# Patient Record
Sex: Female | Born: 1970 | Race: White | Hispanic: No | Smoking: Current some day smoker
Health system: Southern US, Community
[De-identification: ages and names within clinical notes are randomized; demographics above are authoritative.]

## PROBLEM LIST (undated history)

## (undated) DIAGNOSIS — G935 Compression of brain: Secondary | ICD-10-CM

## (undated) DIAGNOSIS — E119 Type 2 diabetes mellitus without complications: Secondary | ICD-10-CM

## (undated) HISTORY — PX: NO PAST SURGERIES: SHX2092

## (undated) HISTORY — DX: Compression of brain: G93.5

---

## 2001-07-13 ENCOUNTER — Other Ambulatory Visit: Admission: RE | Admit: 2001-07-13 | Discharge: 2001-07-13 | Payer: Self-pay | Admitting: Obstetrics and Gynecology

## 2003-05-11 ENCOUNTER — Other Ambulatory Visit: Admission: RE | Admit: 2003-05-11 | Discharge: 2003-05-11 | Payer: Self-pay | Admitting: Obstetrics and Gynecology

## 2004-06-14 ENCOUNTER — Other Ambulatory Visit: Admission: RE | Admit: 2004-06-14 | Discharge: 2004-06-14 | Payer: Self-pay | Admitting: Obstetrics and Gynecology

## 2006-10-01 ENCOUNTER — Ambulatory Visit (HOSPITAL_COMMUNITY): Admission: RE | Admit: 2006-10-01 | Discharge: 2006-10-02 | Payer: Self-pay | Admitting: Orthopaedic Surgery

## 2012-06-06 ENCOUNTER — Emergency Department (HOSPITAL_BASED_OUTPATIENT_CLINIC_OR_DEPARTMENT_OTHER)
Admission: EM | Admit: 2012-06-06 | Discharge: 2012-06-06 | Disposition: A | Discharge status: Home / Self Care | Payer: BC Managed Care – PPO | Attending: Emergency Medicine | Admitting: Emergency Medicine

## 2012-06-06 ENCOUNTER — Ambulatory Visit (HOSPITAL_BASED_OUTPATIENT_CLINIC_OR_DEPARTMENT_OTHER)
Admission: RE | Admit: 2012-06-06 | Discharge: 2012-06-06 | Disposition: A | Discharge status: Home / Self Care | Payer: BC Managed Care – PPO | Source: Ambulatory Visit | Attending: Emergency Medicine | Admitting: Emergency Medicine

## 2012-06-06 ENCOUNTER — Encounter (HOSPITAL_BASED_OUTPATIENT_CLINIC_OR_DEPARTMENT_OTHER): Payer: Self-pay | Admitting: Emergency Medicine

## 2012-06-06 ENCOUNTER — Other Ambulatory Visit (HOSPITAL_BASED_OUTPATIENT_CLINIC_OR_DEPARTMENT_OTHER): Payer: Self-pay | Admitting: Emergency Medicine

## 2012-06-06 DIAGNOSIS — T1490XA Injury, unspecified, initial encounter: Secondary | ICD-10-CM

## 2012-06-06 DIAGNOSIS — Y939 Activity, unspecified: Secondary | ICD-10-CM | POA: Insufficient documentation

## 2012-06-06 DIAGNOSIS — S6990XA Unspecified injury of unspecified wrist, hand and finger(s), initial encounter: Secondary | ICD-10-CM | POA: Insufficient documentation

## 2012-06-06 DIAGNOSIS — W2209XA Striking against other stationary object, initial encounter: Secondary | ICD-10-CM | POA: Insufficient documentation

## 2012-06-06 DIAGNOSIS — S60229A Contusion of unspecified hand, initial encounter: Secondary | ICD-10-CM | POA: Insufficient documentation

## 2012-06-06 DIAGNOSIS — X58XXXA Exposure to other specified factors, initial encounter: Secondary | ICD-10-CM | POA: Insufficient documentation

## 2012-06-06 DIAGNOSIS — Y929 Unspecified place or not applicable: Secondary | ICD-10-CM | POA: Insufficient documentation

## 2012-06-06 DIAGNOSIS — T148XXA Other injury of unspecified body region, initial encounter: Secondary | ICD-10-CM

## 2012-06-06 MED ORDER — OXYCODONE-ACETAMINOPHEN 5-325 MG PO TABS
2.0000 | ORAL_TABLET | Freq: Once | ORAL | Status: AC
  Administered 2012-06-06: 2 via ORAL
  Filled 2012-06-06: qty 2

## 2012-06-06 MED ORDER — HYDROCODONE-ACETAMINOPHEN 5-500 MG PO TABS: 1.0000 | ORAL_TABLET | Freq: Four times a day (QID) | ORAL | Status: DC | PRN

## 2012-06-06 NOTE — ED Provider Notes (Signed)
History     CSN: [id number]  Arrival date & time 06/06/12  0101   None     No chief complaint on file.   (Consider location/radiation/quality/duration/timing/severity/associated sxs/prior treatment) Patient is a 41 y.o. female presenting with hand injury. The history is provided by the patient.  Hand Injury  The incident occurred 1 to 2 hours ago. The incident occurred at home. Injury mechanism: door hit right hand. The pain is present in the right hand. The quality of the pain is described as aching. The pain is severe. The pain has been constant since the incident. Pertinent negatives include no fever and no malaise/fatigue. She reports no foreign bodies present. She has tried nothing for the symptoms. The treatment provided no relief.  Door hit hand and it swelled up  No past medical history on file.  No past surgical history on file.  No family history on file.  History  Substance Use Topics  . Smoking status: Not on file  . Smokeless tobacco: Not on file  . Alcohol Use: Not on file    OB History    No data available      Review of Systems  Constitutional: Negative for fever and malaise/fatigue.  All other systems reviewed and are negative.    Allergies  Review of patient's allergies indicates not on file.  Home Medications  No current outpatient prescriptions on file.  There were no vitals taken for this visit.  Physical Exam  Constitutional: She is oriented to person, place, and time. She appears well-developed and well-nourished. No distress.  HENT:  Head: Normocephalic and atraumatic.  Eyes: Conjunctivae normal are normal. Pupils are equal, round, and reactive to light.  Neck: Normal range of motion. Neck supple.  Cardiovascular: Normal rate and regular rhythm.   Pulmonary/Chest: Effort normal and breath sounds normal. She has no wheezes.  Abdominal: Soft. Bowel sounds are normal. There is no tenderness.  Musculoskeletal: Normal range of motion.     Right hand: Normal strength noted.       Hands: Neurological: She is alert and oriented to person, place, and time.  Skin: Skin is warm and dry.  Psychiatric: She has a normal mood and affect.    ED Course  Procedures (including critical care time)   Labs Reviewed  PREGNANCY, URINE   No results found.   No diagnosis found.    MDM  Refused tetanus.  Bacitracin applied.  Return for weakness numbness worsening swelling.  Follow up with hand surgery as needed.  Ice for 20 minutes q 4 hrs.  Elevate above the level of the heart.  Patient verbalizes understanding and agrees to follow up        Evertt Chouinard Smitty Cords, MD 06/06/12 [phone]

## 2019-05-26 ENCOUNTER — Other Ambulatory Visit: Payer: Self-pay | Admitting: Otolaryngology

## 2019-05-26 DIAGNOSIS — IMO0001 Reserved for inherently not codable concepts without codable children: Secondary | ICD-10-CM

## 2019-05-26 DIAGNOSIS — H918X1 Other specified hearing loss, right ear: Secondary | ICD-10-CM

## 2019-10-20 ENCOUNTER — Other Ambulatory Visit: Payer: Self-pay | Admitting: Obstetrics and Gynecology

## 2019-10-20 DIAGNOSIS — D259 Leiomyoma of uterus, unspecified: Secondary | ICD-10-CM

## 2019-10-20 DIAGNOSIS — R5381 Other malaise: Secondary | ICD-10-CM

## 2019-10-28 ENCOUNTER — Other Ambulatory Visit: Payer: Self-pay | Admitting: Obstetrics and Gynecology

## 2019-10-31 ENCOUNTER — Other Ambulatory Visit: Payer: Self-pay | Admitting: Obstetrics and Gynecology

## 2019-10-31 DIAGNOSIS — D259 Leiomyoma of uterus, unspecified: Secondary | ICD-10-CM

## 2019-11-11 ENCOUNTER — Inpatient Hospital Stay: Admission: RE | Admit: 2019-11-11 | Payer: Self-pay | Source: Ambulatory Visit

## 2019-11-17 ENCOUNTER — Ambulatory Visit
Admission: RE | Admit: 2019-11-17 | Discharge: 2019-11-17 | Disposition: A | Discharge status: Home / Self Care | Payer: No Typology Code available for payment source | Source: Ambulatory Visit | Attending: Obstetrics and Gynecology | Admitting: Obstetrics and Gynecology

## 2019-11-17 DIAGNOSIS — D259 Leiomyoma of uterus, unspecified: Secondary | ICD-10-CM

## 2019-11-22 ENCOUNTER — Other Ambulatory Visit: Payer: Self-pay

## 2019-11-28 ENCOUNTER — Other Ambulatory Visit: Payer: Self-pay

## 2020-04-07 ENCOUNTER — Ambulatory Visit
Admission: RE | Admit: 2020-04-07 | Discharge: 2020-04-07 | Disposition: A | Discharge status: Home / Self Care | Payer: BC Managed Care – PPO | Source: Ambulatory Visit | Attending: Otolaryngology | Admitting: Otolaryngology

## 2020-04-07 ENCOUNTER — Other Ambulatory Visit: Payer: Self-pay

## 2020-04-07 DIAGNOSIS — IMO0001 Reserved for inherently not codable concepts without codable children: Secondary | ICD-10-CM

## 2020-04-07 MED ORDER — GADOBENATE DIMEGLUMINE 529 MG/ML IV SOLN
15.0000 mL | Freq: Once | INTRAVENOUS | Status: AC | PRN
  Administered 2020-04-07: 15 mL via INTRAVENOUS

## 2020-04-27 IMAGING — CT CT ABDOMEN W/O CM
2 of 4 series · 16 of 46 positions shown, 18 images · non-contrast
Comparison: None.

CLINICAL DATA: Fibroids, heavy flow periods, history of tummy tuck
and C-section

EXAM:
CT ABDOMEN WITHOUT CONTRAST
TECHNIQUE: Multidetector CT imaging of the abdomen was performed following the
standard protocol without IV contrast.

[Series 2: adrenals 3.00 br40 s3 axial st · axial · 0.78mm/px · z∈[+1375,+1645]mm · 13 of 100 slices shown, 15 images]
[im 5/100  soft-tissue]
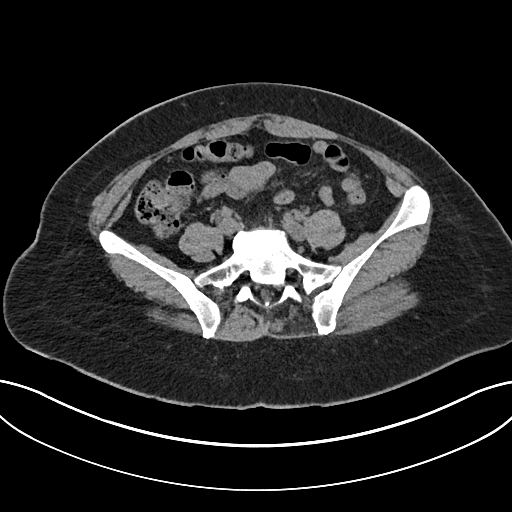
[im 5/100  bone]
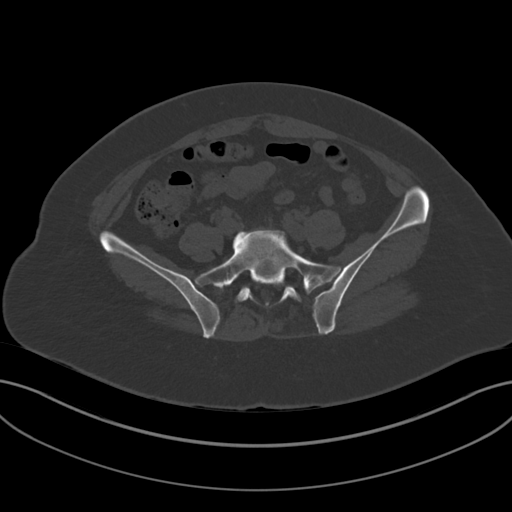
[im 15/100  soft-tissue]
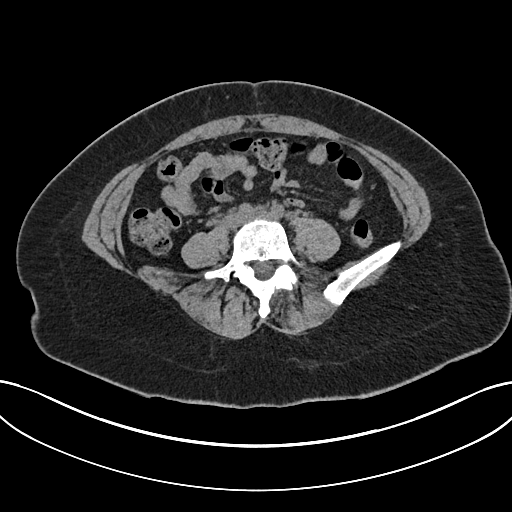
[im 20/100  soft-tissue]
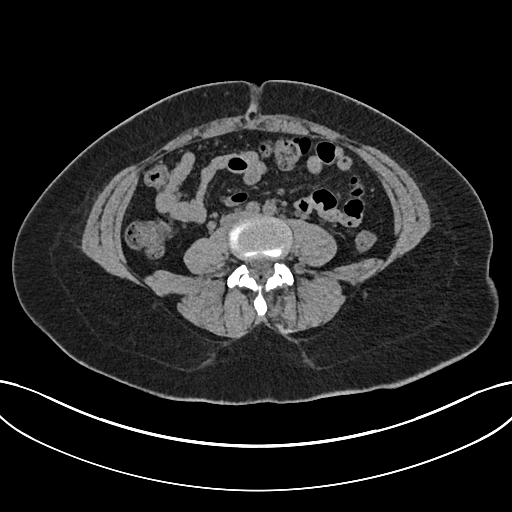
[im 30/100  soft-tissue]
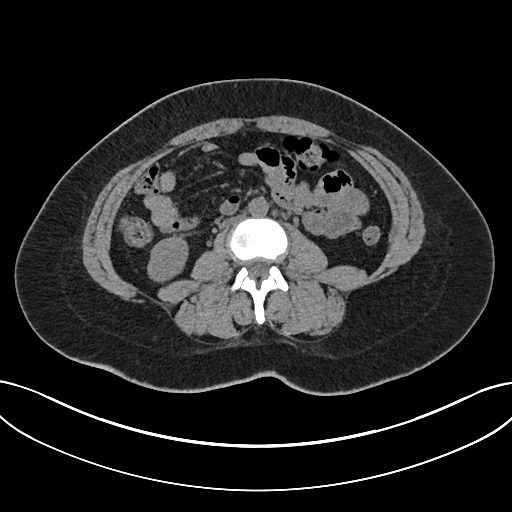
[im 35/100  soft-tissue]
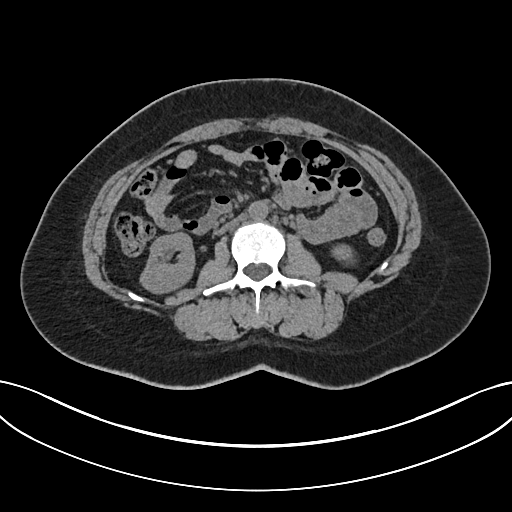
[im 45/100  soft-tissue]
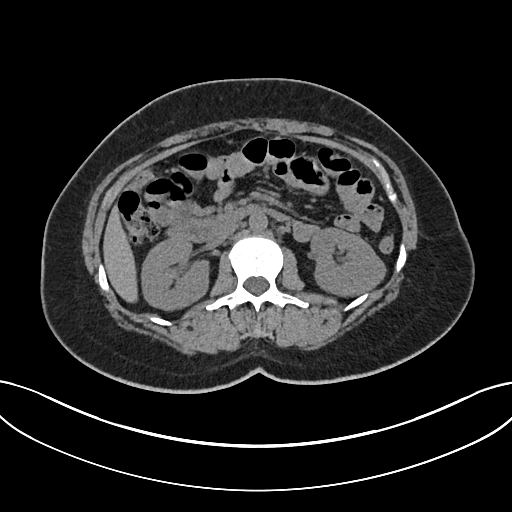
[im 50/100  soft-tissue]
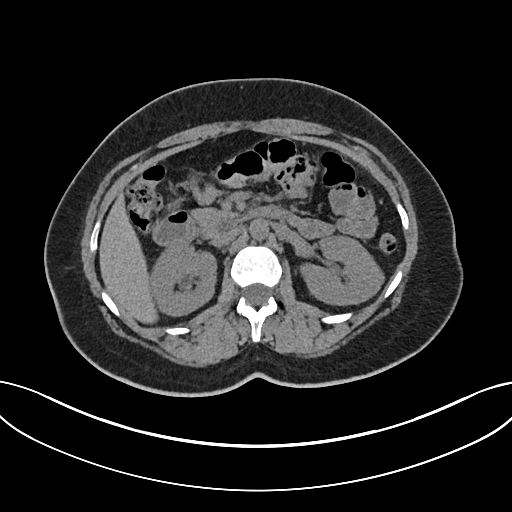
[im 55/100  soft-tissue]
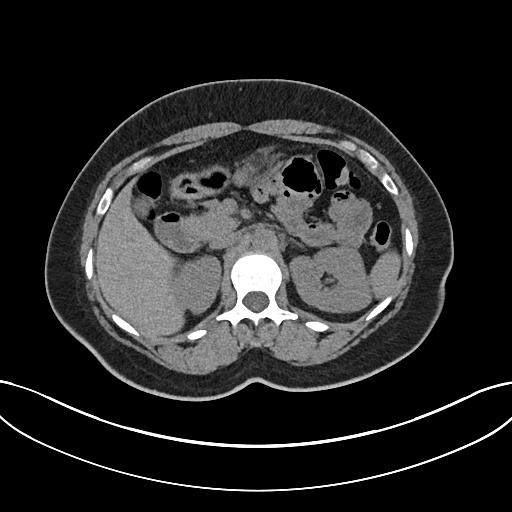
[im 65/100  soft-tissue]
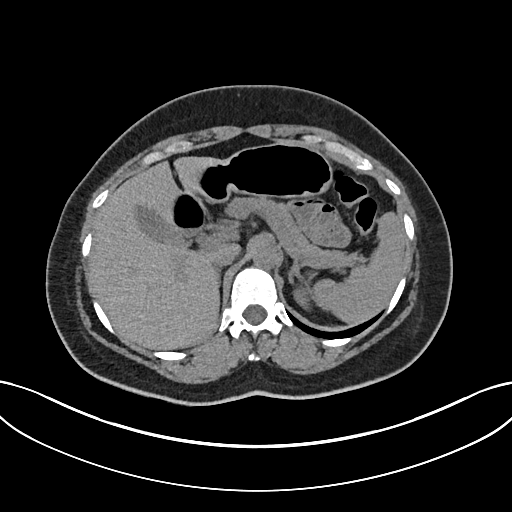
[im 65/100  bone]
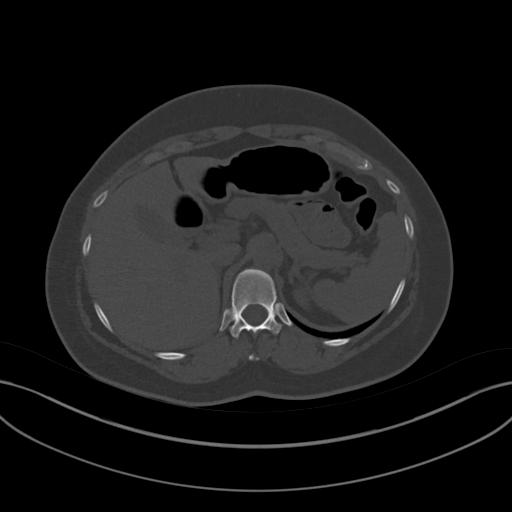
[im 70/100  soft-tissue]
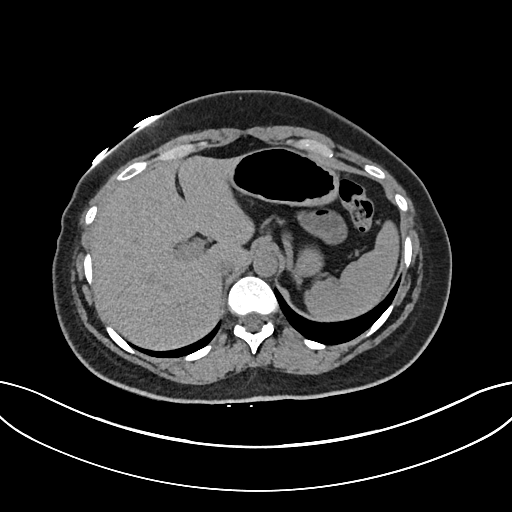
[im 80/100  soft-tissue]
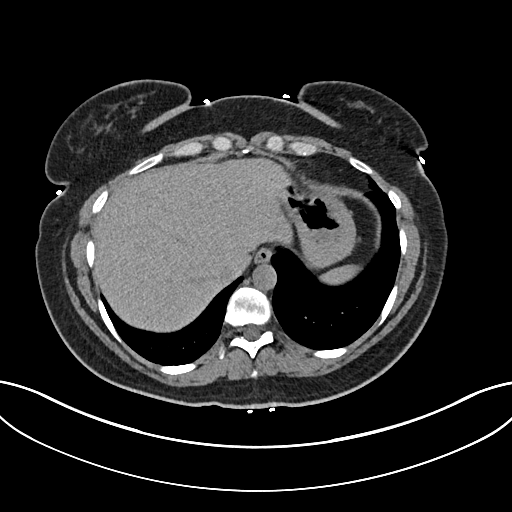
[im 85/100  soft-tissue]
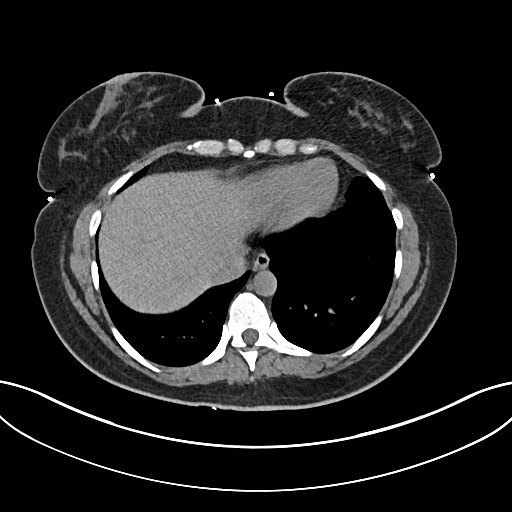
[im 95/100  soft-tissue]
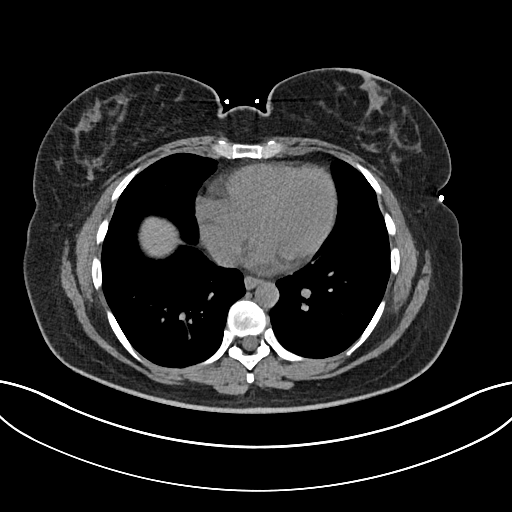

[Series 5: adrenals 2.00 br40 s3 cor st · coronal · 0.59mm/px · 3 of 144 slices shown]
[im 48/144  soft-tissue]
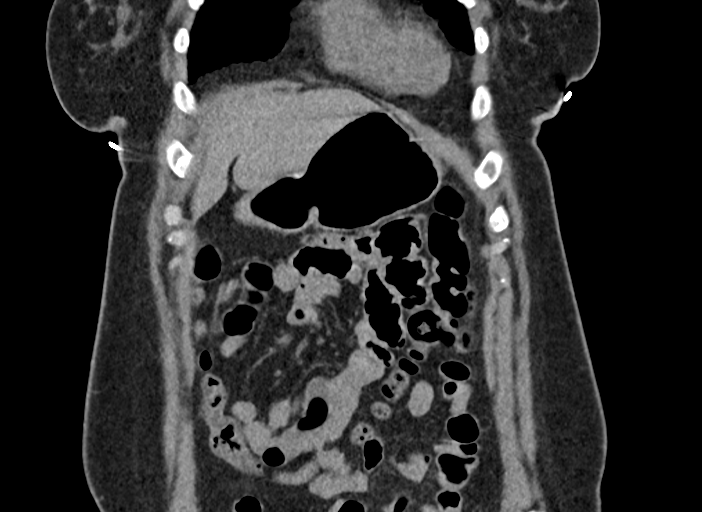
[im 64/144  soft-tissue]
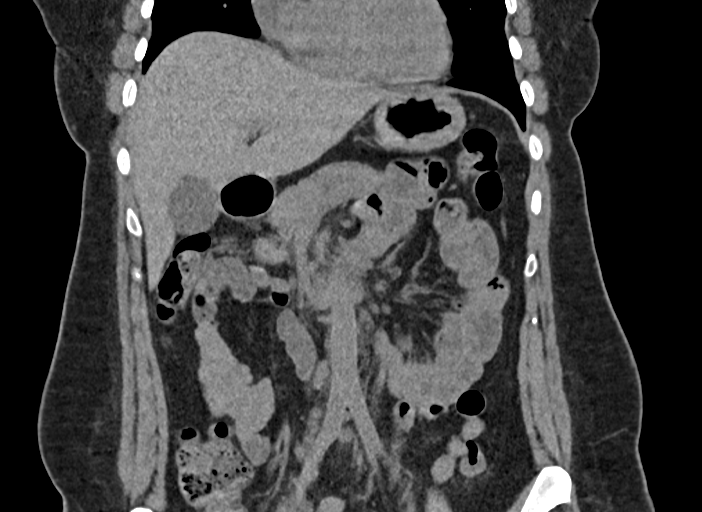
[im 80/144  soft-tissue]
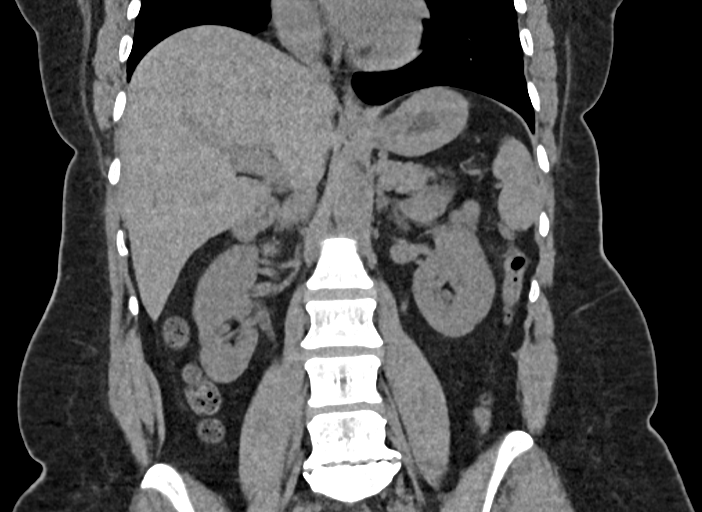

[16 of 46 positions shown; findings below may reference images not displayed]

FINDINGS: Lower chest: No acute abnormality.

Hepatobiliary: No focal liver abnormality is seen. No gallstones,
gallbladder wall thickening, or biliary dilatation.

Pancreas: Unremarkable. No pancreatic ductal dilatation or
surrounding inflammatory changes.

Spleen: Normal in size without focal abnormality.

Adrenals/Urinary Tract: Adrenal glands are unremarkable. Kidneys are
normal, without renal calculi, focal lesion, or hydronephrosis.

Stomach/Bowel: Stomach is within normal limits. Appendix appears
normal. No evidence of bowel wall thickening, distention, or
inflammatory changes.

Vascular/Lymphatic: No significant vascular findings are present. No
enlarged abdominal lymph nodes.

Other: No abdominal wall hernia or abnormality.

Musculoskeletal: No acute or significant osseous findings.
IMPRESSION: No noncontrast CT abnormality of the abdomen.

## 2021-07-04 LAB — COLOGUARD: COLOGUARD: NEGATIVE

## 2022-07-01 ENCOUNTER — Telehealth: Payer: Self-pay

## 2022-07-01 NOTE — Telephone Encounter (Signed)
Spoke with patient - She was seen at [facility]. Records are being sent over to [place] and the patient is requesting an appointment with Dr Jaynee Eagles at our office. Ct and MRI discs are being sent over to [place] through the mail, should be expected either Friday or Monday.

## 2022-07-01 NOTE — Telephone Encounter (Signed)
Spoke with patient again. Stated she spoke with [facility] and they have shared her imaging with [place] through [facility] and both faxed and mailed her records over to [place]. We will review records once received

## 2022-09-24 ENCOUNTER — Ambulatory Visit: Payer: BC Managed Care – PPO | Admitting: Neurology

## 2022-09-24 NOTE — Progress Notes (Deleted)
[facility]    Provider:  Dr Jaynee Eagles Requesting Provider: Leonel Ramsay, MD Primary Care Provider:  Haywood Pao, MD  CC:  ***  HPI:  Tara Torres is a 52 y.o. female here as requested by Leonel Ramsay, MD for ***.  ***  04/08/2020: CLINICAL DATA:  Asymmetric hearing loss, right   EXAM: MRI HEAD WITHOUT AND WITH CONTRAST   TECHNIQUE: Multiplanar, multiecho pulse sequences of the brain and surrounding structures were obtained without and with intravenous contrast.   CONTRAST:  85m MULTIHANCE GADOBENATE DIMEGLUMINE 529 MG/ML IV SOLN   COMPARISON:  None.   FINDINGS: BRAIN: No acute infarct, acute hemorrhage or extra-axial collection. Normal white matter signal. Normal volume of CSF spaces. No chronic microhemorrhage. Normal midline structures. There is no cerebellopontine angle mass. The cochleae and semicircular canals are normal. No focal abnormality along the course of the 7th and 8th cranial nerves. Normal porus acusticus and vestibular aqueduct bilaterally.   VASCULAR: Major flow voids are preserved.   SKULL AND UPPER CERVICAL SPINE: Normal calvarium and skull base. Visualized upper cervical spine and soft tissues are normal.   SINUSES/ORBITS: No paranasal sinus fluid levels or advanced mucosal thickening. No mastoid or middle ear effusion. Normal orbits.   IMPRESSION: Normal MRI of the brain and internal auditory canals.  Reviewed notes, labs and imaging from outside physicians, which showed ***  Review of Systems: Patient complains of symptoms per HPI as well as the following symptoms ***. Pertinent negatives and positives per HPI. All others negative.   Social History   Socioeconomic History   Marital status: Divorced    Spouse name: Not on file   Number of children: Not on file   Years of education: Not on file   Highest education level: Not on file  Occupational History   Not on file  Tobacco Use   Smoking status:  Not on file   Smokeless tobacco: Not on file  Substance and Sexual Activity   Alcohol use: Not on file   Drug use: Not on file   Sexual activity: Not on file  Other Topics Concern   Not on file  Social History Narrative   Not on file   Social Determinants of Health   Financial Resource Strain: Not on file  Food Insecurity: Not on file  Transportation Needs: Not on file  Physical Activity: Not on file  Stress: Not on file  Social Connections: Not on file  Intimate Partner Violence: Not on file    No family history on file.  No past medical history on file.  There are no problems to display for this patient.   *** The histories are not reviewed yet. Please review them in the "History" navigator section and refresh this SElkhart  Current Outpatient Medications  Medication Sig Dispense Refill   HYDROcodone-acetaminophen (VICODIN) 5-500 MG per tablet Take 1 tablet by mouth every 6 (six) hours as needed for pain. 10 tablet 0   No current facility-administered medications for this visit.    Allergies as of 09/24/2022   (Not on File)    Vitals: There were no vitals taken for this visit. Last Weight:  Wt Readings from Last 1 Encounters:  No data found for Wt   Last Height:   Ht Readings from Last 1 Encounters:  No data found for Ht     Physical exam: Exam: Gen: NAD, conversant, well nourised, obese, well groomed  CV: RRR, no MRG. No Carotid Bruits. No peripheral edema, warm, nontender Eyes: Conjunctivae clear without exudates or hemorrhage  Neuro: Detailed Neurologic Exam  Speech:    Speech is normal; fluent and spontaneous with normal comprehension.  Cognition:    The patient is oriented to person, place, and time;     recent and remote memory intact;     language fluent;     normal attention, concentration,     fund of knowledge Cranial Nerves:    The pupils are equal, round, and reactive to light. The fundi are normal and  spontaneous venous pulsations are present. Visual fields are full to finger confrontation. Extraocular movements are intact. Trigeminal sensation is intact and the muscles of mastication are normal. The face is symmetric. The palate elevates in the midline. Hearing intact. Voice is normal. Shoulder shrug is normal. The tongue has normal motion without fasciculations.   Coordination:    Normal finger to nose and heel to shin. Normal rapid alternating movements.   Gait:    Heel-toe and tandem gait are normal.   Motor Observation:    No asymmetry, no atrophy, and no involuntary movements noted. Tone:    Normal muscle tone.    Posture:    Posture is normal. normal erect    Strength:    Strength is V/V in the upper and lower limbs.      Sensation: intact to LT     Reflex Exam:  DTR's:    Deep tendon reflexes in the upper and lower extremities are normal bilaterally.   Toes:    The toes are downgoing bilaterally.   Clonus:    Clonus is absent.    Assessment/Plan:      No orders of the defined types were placed in this encounter.  No orders of the defined types were placed in this encounter.   Cc: Leonel Ramsay, MD,  Tisovec, Fransico Him, MD  Sarina Ill, MD  [facility] [address] [place], [place] [address]  Phone [phone] Fax [phone]

## 2022-09-29 ENCOUNTER — Ambulatory Visit: Payer: BC Managed Care – PPO | Admitting: Neurology

## 2022-09-29 ENCOUNTER — Encounter: Payer: Self-pay | Admitting: Neurology

## 2022-09-29 VITALS — Ht 63.0 in | Wt 174.0 lb

## 2022-09-29 DIAGNOSIS — H9191 Unspecified hearing loss, right ear: Secondary | ICD-10-CM

## 2022-09-29 DIAGNOSIS — R200 Anesthesia of skin: Secondary | ICD-10-CM

## 2022-09-29 DIAGNOSIS — R252 Cramp and spasm: Secondary | ICD-10-CM

## 2022-09-29 DIAGNOSIS — R42 Dizziness and giddiness: Secondary | ICD-10-CM

## 2022-09-29 DIAGNOSIS — H9313 Tinnitus, bilateral: Secondary | ICD-10-CM

## 2022-09-29 DIAGNOSIS — G935 Compression of brain: Secondary | ICD-10-CM

## 2022-09-29 DIAGNOSIS — R2689 Other abnormalities of gait and mobility: Secondary | ICD-10-CM

## 2022-09-29 DIAGNOSIS — H93A9 Pulsatile tinnitus, unspecified ear: Secondary | ICD-10-CM

## 2022-09-29 DIAGNOSIS — G950 Syringomyelia and syringobulbia: Secondary | ICD-10-CM

## 2022-09-29 MED ORDER — ACETAZOLAMIDE 125 MG PO TABS: 125.0000 mg | ORAL_TABLET | Freq: Two times a day (BID) | ORAL | 6 refills | Status: DC

## 2022-09-29 NOTE — Progress Notes (Unsigned)
[facility]    Provider:  Dr Jaynee Eagles Requesting Provider: Tisovec, Fransico Him, MD Primary Care Provider:  Jettie Booze, NP  CC:  chiari  HPI:  Tara Torres is a 52 y.o. female here as requested by Tisovec, Fransico Him, MD for chiari malformation. 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 No headaches. Hearing loss on the right, only on the right, some dizziness with closing eyes, she has some stiffening of her fingers. Some numbness in the lateral thighs, she feels she is not as sharp. No neck pain. No cough headaches. No exertional headaches. She has bruxism but no exertional headaches. A few times after exercising has heard a heartbeat in her head/pulsatile tinnitus. Happened a few times and not always with exercises. She has a lot of tinnitus. More hearing loss on the right especially when laying down. She has some numbness on the left side of the face. No numbness. But cramping in the hands. We discussed chiari in detail, extended visit, reviewed images from 2021 and 2023, compared, discussed etiology, symptoms  Reviewed notes, labs and imaging from outside physicians, which showed:  Reviewed EKG 60 November 2023 normal sinus rhythm, normal EKG  Labs collected June 19, 2022 including a unremarkable CBC slight anemia hemoglobin 11.6 otherwise unremarkable, CMP with elevated glucose 171, BUN 8, creatinine 0.66 otherwise unremarkable,hgba1c 7.3 05/2022  04/08/2020: CLINICAL DATA:  Asymmetric hearing loss, right   EXAM: MRI HEAD WITHOUT AND WITH CONTRAST 2021   TECHNIQUE: Multiplanar, multiecho pulse sequences of the brain and surrounding structures were obtained without and with intravenous contrast.   CONTRAST:  39m MULTIHANCE GADOBENATE DIMEGLUMINE 529 MG/ML IV SOLN   COMPARISON:  None.   FINDINGS: (disagree with report on chiari, reviewed  images, chiari also present in 2021 appears stable as compared to 2023) BRAIN: No acute infarct, acute hemorrhage or extra-axial collection. Normal white matter signal. Normal volume of CSF spaces. No chronic microhemorrhage. Normal midline structures. There is no cerebellopontine angle mass. The cochleae and semicircular canals are normal. No focal abnormality along the course of the 7th and 8th cranial nerves. Normal porus acusticus and vestibular aqueduct bilaterally.   VASCULAR: Major flow voids are preserved.   SKULL AND UPPER CERVICAL SPINE: Normal calvarium and skull base. Visualized upper cervical spine and soft tissues are normal.   SINUSES/ORBITS: No paranasal sinus fluid levels or advanced mucosal thickening. No mastoid or middle ear effusion. Normal orbits.   IMPRESSION: Normal MRI of the brain and internal auditory canals.  MRI brain 06/2022: reviewd images and agree: MRI brain without contrast:  INDICATION:  TECHNIQUE: Imaging of the brain was performed in 3 planes utilizing a combination of T1, FLAIR, and T2 weighting. Diffusion images were performed.  COMPARISON:  FINDINGS: #  Ventricles: Normal without dilatation, mass effect, or midline shift. #  Brain parenchyma: No areas of abnormally increased or abnormally decreased signal intensity are identified.  Diffusion-weighted images are normal indicating no acute infarct.  No evidence mass or hemorrhage.   #  Sella and parasellar region: Normal. #  Craniovertebral junction: Normal. #  Mastoids and paranasal sinuses: Clear #  Orbits: Unremarkable #  Major cerebral vessels and dural sinuses: Patent #  Bony structures and scalp: Unremarkable Allograft there is herniation of the right cerebellar tonsil approximately 7.5 mm beyond the foramen magnum. There is herniation of the right cerebellar tonsil approximately 3  mm beyond the foramen magnum.  #   Procedure Note  Lucy Chris, MD -  06/19/2022 Formatting of this note might be different from the original. MRI brain without contrast:  INDICATION:  TECHNIQUE: Imaging of the brain was performed in 3 planes utilizing a combination of T1, FLAIR, and T2 weighting. Diffusion images were performed.  COMPARISON:  FINDINGS: #  Ventricles: Normal without dilatation, mass effect, or midline shift. #  Brain parenchyma: No areas of abnormally increased or abnormally decreased signal intensity are identified.  Diffusion-weighted images are normal indicating no acute infarct.  No evidence mass or hemorrhage.   #  Sella and parasellar region: Normal. #  Craniovertebral junction: Normal. #  Mastoids and paranasal sinuses: Clear #  Orbits: Unremarkable #  Major cerebral vessels and dural sinuses: Patent #  Bony structures and scalp: Unremarkable Allograft there is herniation of the right cerebellar tonsil approximately 7.5 mm beyond the foramen magnum. There is herniation of the right cerebellar tonsil approximately 3 mm beyond the foramen magnum.  #     IMPRESSION: No intracranial hemorrhage.  No acute infarction.  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   Review of Systems: Patient complains of symptoms per HPI as well as the following symptoms dizziness, hearing loss, tinnitus. Pertinent negatives and positives per HPI. All others negative.   Social History   Socioeconomic History   Marital status: Divorced    Spouse name: Not on file   Number of children: Not on file   Years of education: Not on file   Highest education level: Not on file  Occupational History   Not on file  Tobacco Use   Smoking status: Never   Smokeless tobacco: Never  Substance and Sexual Activity   Alcohol use: Yes    Alcohol/week: 8.0 standard drinks of alcohol    Types: 8 Glasses of wine per week   Drug use: Not on file   Sexual activity: Not on  file  Other Topics Concern   Not on file  Social History Narrative   Not on file   Social Determinants of Health   Financial Resource Strain: Not on file  Food Insecurity: Not on file  Transportation Needs: Not on file  Physical Activity: Not on file  Stress: Not on file  Social Connections: Not on file  Intimate Partner Violence: Not on file    Family History  Problem Relation Age of Onset   Chiari malformation Neg Hx     Past Medical History:  Diagnosis Date   Chiari I malformation [facility])     Patient Active Problem List   Diagnosis Date Noted   Chiari I malformation ([place]) 09/30/2022    Past Surgical History:  Procedure Laterality Date   NO PAST SURGERIES      Current Outpatient Medications  Medication Sig Dispense Refill   acetaZOLAMIDE (DIAMOX) 125 MG tablet Take 1 tablet (125 mg total) by mouth 2 (two) times daily. 60 tablet 6   diazepam (VALIUM) 5 MG tablet TAKE 1 TABLET BY MOUTH EVERY TWELVE HOURS AS NEEDED FOR anxiety OR SLEEP     Ferrous Sulfate (IRON PO) Take 65 mg by mouth daily at 2 PM. 3 tablets daily     HUMALOG 100 UNIT/ML injection Inject into the skin.     Insulin Disposable Pump (OMNIPOD 5 G6 PODS, GEN 5,) MISC Inject into the skin.     tranexamic  acid (LYSTEDA) 650 MG TABS tablet Take 1,300 mg by mouth.     VITAMIN D PO Take by mouth.     No current facility-administered medications for this visit.    Allergies as of 09/29/2022   (Not on File)    Vitals: Ht '5\' 3"'$  (1.6 m)   Wt 174 lb (78.9 kg)   BMI 30.82 kg/m  Last Weight:  Wt Readings from Last 1 Encounters:  09/29/22 174 lb (78.9 kg)   Last Height:   Ht Readings from Last 1 Encounters:  09/29/22 '5\' 3"'$  (1.6 m)     Physical exam: Exam: Gen: NAD, conversant, well nourised, obese, well groomed                     CV: RRR, no MRG. No Carotid Bruits. No peripheral edema, warm, nontender Eyes: Conjunctivae clear without exudates or hemorrhage  Neuro: Detailed Neurologic  Exam  Speech:    Speech is normal; fluent and spontaneous with normal comprehension.  Cognition:    The patient is oriented to person, place, and time;     recent and remote memory intact;     language fluent;     normal attention, concentration,     fund of knowledge Cranial Nerves:    The pupils are equal, round, and reactive to light. The fundi are normal and spontaneous venous pulsations are present. Visual fields are full to finger confrontation. Extraocular movements are intact. Trigeminal sensation is intact and the muscles of mastication are normal. The face is symmetric. The palate elevates in the midline. Hearing intact. Voice is normal. Shoulder shrug is normal. The tongue has normal motion without fasciculations.   Coordination:    Normal finger to nose and heel to shin. Normal rapid alternating movements.   Gait:    Heel-toe and tandem gait are normal.   Motor Observation:    No asymmetry, no atrophy, and no involuntary movements noted. Tone:    Normal muscle tone.    Posture:    Posture is normal. normal erect    Strength:    Strength is V/V in the upper and lower limbs.      Sensation: intact to LT     Reflex Exam:  DTR's:    Deep tendon reflexes in the upper and lower extremities are normal bilaterally.   Toes:    The toes are downgoing bilaterally.   Clonus:    Clonus is absent.    Assessment/Plan:  Patient with there is herniation of the right cerebellar tonsil approximately 7.5 mm beyond the foramen magnum. There is herniation of the left cerebellar tonsil approximately 3 mm beyond the foramen magnum.  Patient has hearing loss on the right, imbalance, tinnitus, dizziness, all on the right considering the right cerebellar tonsil distention is much larger than the left I do suspect this may be symptomatic although she is not having some of the other more common symptoms such as exertional headaches but she does have pulsatile tinnitus.  Also having problems  with her hands and cramping need to check MRI of the cervical spine for syrinx and MRA for aneurysm.  Will send to neurosurgery for evaluation of decompression surgery.  In the meantime we will try some Diamox to see if this lessens her symptoms. All images are in canopy.       Orthostatic BP 100/52 87/57 138/68  Orthostatic Pulse 67 74 82  Weight 174 lb (78.9 kg)    Height '5\' 3"'$  (1.6 m)  2023 MRI images: see both 2021 and 2023 MRIs in canopy          Orders Placed This Encounter  Procedures   MR ANGIO HEAD WO CONTRAST   MR CERVICAL SPINE [place]   Ambulatory referral to Neurosurgery   Meds ordered this encounter  Medications   acetaZOLAMIDE (DIAMOX) 125 MG tablet    Sig: Take 1 tablet (125 mg total) by mouth 2 (two) times daily.    Dispense:  60 tablet    Refill:  6    Cc: Tisovec, Fransico Him, MD,  Jettie Booze, NP  Sarina Ill, MD  [facility] [address] [place], [place] [address]  Phone [phone] Fax [phone]  I spent over 110 minutes of face-to-face and non-face-to-face time with patient on the  1. Chiari I malformation ([place])   2. Tinnitus of both ears   3. Hearing loss of right ear, unspecified hearing loss type   4. Dizziness   5. Imbalance   6. 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 Hand numbness   9. Hand cramp    diagnosis.  This included previsit chart review, lab review, study review, order entry, electronic health record documentation, patient education on the different diagnostic and therapeutic options, counseling and coordination of care, risks and benefits of management, compliance, or risk factor reduction

## 2022-09-29 NOTE — Patient Instructions (Addendum)
MRA head MRI cervical spine to look for syrinx Referral to neurosurgery - Dr. Zada Finders  Acetazolamide Tablets What is this medication? ACETAZOLAMIDE (a set a ZOLE a mide) reduces swelling related to heart disease. It may also be used to reduce swelling caused by medications. It helps your kidneys remove more fluid and salt from your blood through the urine. It may also be used to treat conditions with increased pressure of the eye, such as glaucoma. It can be used with other medications to prevent and control seizures in people with epilepsy. It can also be used to prevent or treat symptoms of altitude sickness. It works by increasing the amount of oxygen in your body. It belongs to a group of medications called diuretics. This medicine may be used for other purposes; ask your health care provider or pharmacist if you have questions. COMMON BRAND NAME(S): Diamox What should I tell my care team before I take this medication? They need to know if you have any of these conditions: Glaucoma Kidney disease Liver disease Low adrenal gland function Lung or breathing disease An unusual or allergic reaction to acetazolamide, other medications, foods, dyes, or preservatives Pregnant or trying to get pregnant Breast-feeding How should I use this medication? Take this medication by mouth. Take it as directed on the prescription label at the same time every day. You can take it with or without food. If it upsets your stomach, take it with food. Keep taking it unless your care team tells you to stop. Talk to your care team about the use of this medication in children. Special care may be needed. Overdosage: If you think you have taken too much of this medicine contact a poison control center or emergency room at once. NOTE: This medicine is only for you. Do not share this medicine with others. What if I miss a dose? If you miss a dose, take it as soon as you can. If it is almost time for your next dose,  take only that dose. Do not take double or extra doses. What may interact with this medication? Do not take this medication with any of the following: Methazolamide This medication may also interact with the following: Aspirin and aspirin-like medications Cyclosporine Lithium Medication for diabetes Methenamine Other diuretics Phenytoin Primidone Quinidine Sodium bicarbonate Stimulant medications, such as dextroamphetamine This list may not describe all possible interactions. Give your health care provider a list of all the medicines, herbs, non-prescription drugs, or dietary supplements you use. Also tell them if you smoke, drink alcohol, or use illegal drugs. Some items may interact with your medicine. What should I watch for while using this medication? Visit your care team for regular checks on your progress. Tell your care team if your symptoms do not start to get better or if they get worse. This medication may cause serious skin reactions. They can happen weeks to months after starting the medication. Contact your care team right away if you notice fevers or flu-like symptoms with a rash. The rash may be red or purple and then turn into blisters or peeling of the skin. Or, you might notice a red rash with swelling of the face, lips, or lymph nodes in your neck or under your arms. This medication may affect your coordination, reaction time, or judgment. Do not drive or operate machinery until you know how this medication affects you. Sit up or stand slowly to reduce the risk of dizzy or fainting spells. What side effects may I notice from  receiving this medication? Side effects that you should report to your care team as soon as possible: Allergic reactions--skin rash, itching, hives, swelling of the face, lips, tongue, or throat Aplastic anemia--unusual weakness or fatigue, dizziness, headache, trouble breathing, increased bleeding or bruising High acid level--trouble breathing,  unusual weakness or fatigue, confusion, headache, fast or irregular heartbeat, nausea, vomiting Infection--fever, chills, cough, or sore throat Kidney stones--blood in the urine, pain or trouble passing urine, pain in the lower back or sides Liver injury--right upper belly pain, loss of appetite, nausea, light-colored stool, dark yellow or brown urine, yellowing skin or eyes, unusual weakness or fatigue Low potassium level--muscle pain or cramps, unusual weakness or fatigue, fast or irregular heartbeat, constipation Redness, blistering, peeling, or loosening of the skin, including inside the mouth Side effects that usually do not require medical attention (report to your care team if they continue or are bothersome): Blurry vision Change in taste Loss of appetite Pain, tingling, or numbness in the hands or feet This list may not describe all possible side effects. Call your doctor for medical advice about side effects. You may report side effects to FDA at 1-800-FDA-1088. Where should I keep my medication? Keep out of the reach of children and pets. Store at room temperature between 20 and 25 degrees C (68 and 77 degrees F). Throw away any unused medication after the expiration date. NOTE: This sheet is a summary. It may not cover all possible information. If you have questions about this medicine, talk to your doctor, pharmacist, or health care provider.  2023 Elsevier/Gold Standard (2021-06-04 00:00:00)   Chiari Malformation  Chiari malformation (CM) is a type of brain abnormality that affects the parts of the brain called the cerebellum and the brain stem. The cerebellum is important for balance, and the brain stem is important for basic body functions, such as breathing and swallowing. Normally, the cerebellum is located in a space at the back of the skull, just above the opening in the skull (foramen magnum)where the spinal cord meets the brain stem. With CM, part of the cerebellum is  located below the foramen magnum instead. The malformation can be mild with no or few symptoms, or it can be severe. CM can cause neck pain, headaches, balance problems, and other symptoms. What are the causes? CM is a condition that a person is born with (congenital). In rare cases, CM may also develop later in life, which is called acquired CM or secondary CM. These cases may be caused by a leak of the fluid that surrounds and protects the brain and spinal cord (cerebrospinal fluid), leading to low pressure. In acquired or secondary CM, abnormal pressure develops in the brain. This pushes the cerebellum down into the foramen magnum. What increases the risk? The following factors may make you more likely to develop this condition: Being female. Having a family history of CM. What are the signs or symptoms? Symptoms of this condition may vary depending on the severity of your CM. In some cases, there are no symptoms. In other cases, symptoms may come and go. The most common symptom is a severe headache in the back of the head. The headache: May come and go. May spread to your neck and shoulders. May be worse when you cough, sneeze, or strain. Other symptoms include: Difficulty balancing or loss of coordination. Vision problems, such as double vision, tiny spots moving across your vision (floaters), or sensitivity to lights. Trouble swallowing or speaking or hearing. Feeling dizzy or fainting.  Breathing problems, including breathing pauses during sleep (sleep apnea). Curved back (scoliosis). Inability to control when you urinate (incontinence). How is this diagnosed? This condition may be evaluated with a medical history and physical exam. This may include tests to check your balance and nerves (neurological exam). You may also have imaging tests, such as a CT scan or an MRI. How is this treated? Treatment for this condition depends on the severity of your symptoms. You may be treated  with: Surgery to prevent the malformation from getting worse, or to treat severe symptoms or symptoms that are getting worse. Medicines or alternative treatments to relieve headaches or neck pain. If you do not have symptoms, you may not need treatment. Follow these instructions at home: Medicines Take over-the-counter and prescription medicines only as told by your health care provider. Ask your health care provider if the medicine prescribed to you requires you to avoid driving or using machinery. General instructions If you feel like you might faint: Lie down right away and raise (elevate) your feet above the level of your heart. Breathe deeply and steadily. Wait until all of the symptoms have passed. If you have problems with dizziness, get up slowly when lying down. Take several minutes to sit and then stand. Ask your health care provider which activities are safe for you and if you have any activity restrictions. Do not use any products that contain nicotine or tobacco. These products include cigarettes, chewing tobacco, and vaping devices, such as e-cigarettes. If you need help quitting, ask your health care provider. Drink enough fluid to keep your urine pale yellow. Consider joining a CM support group. Keep all follow-up visits. This is important. Where to find more information [employer] of Neurological Disorders and Stroke: [employer]Box[employer]it Contact a health care provider if: You have new symptoms. Your symptoms get worse. Get help right away if: You develop weakness or numbness in one or more of your limbs. You develop dizziness, slurred speech, double vision, weakness, or numbness with a severe headache. These symptoms may represent a serious problem that is an emergency. Do not wait to see if the symptoms will go away. Get medical help right away. Call your local emergency services (911 in the [place] Summary A Chiari malformation is a condition in which part of the  cerebellum moves down through the foramen magnum. The malformation can be mild with no symptoms, or it can be severe. In some cases, no treatment is needed. In others, medicines are used to treat headaches. Surgery is done in the worst of cases. This information is not intended to replace advice given to you by your health care provider. Make sure you discuss any questions you have with your health care provider. Document Revised: 10/16/2020 Document Reviewed: 10/16/2020 Elsevier Patient Education  [employer].

## 2022-09-30 ENCOUNTER — Encounter: Payer: Self-pay | Admitting: Neurology

## 2022-09-30 DIAGNOSIS — G935 Compression of brain: Secondary | ICD-10-CM | POA: Insufficient documentation

## 2022-10-01 ENCOUNTER — Telehealth: Payer: Self-pay | Admitting: Neurology

## 2022-10-01 NOTE — Telephone Encounter (Signed)
Referral for neurosurgery fax to [facility]. Phone: [phone], Fax: [phone].

## 2022-10-08 ENCOUNTER — Ambulatory Visit (INDEPENDENT_AMBULATORY_CARE_PROVIDER_SITE_OTHER): Payer: BC Managed Care – PPO

## 2022-10-08 DIAGNOSIS — H93A9 Pulsatile tinnitus, unspecified ear: Secondary | ICD-10-CM

## 2022-10-08 DIAGNOSIS — R200 Anesthesia of skin: Secondary | ICD-10-CM | POA: Diagnosis not present

## 2022-10-08 DIAGNOSIS — H9191 Unspecified hearing loss, right ear: Secondary | ICD-10-CM

## 2022-10-08 DIAGNOSIS — G935 Compression of brain: Secondary | ICD-10-CM | POA: Diagnosis not present

## 2022-10-08 DIAGNOSIS — R2689 Other abnormalities of gait and mobility: Secondary | ICD-10-CM | POA: Diagnosis not present

## 2022-10-08 DIAGNOSIS — H9313 Tinnitus, bilateral: Secondary | ICD-10-CM

## 2022-10-08 DIAGNOSIS — G950 Syringomyelia and syringobulbia: Secondary | ICD-10-CM

## 2022-10-08 DIAGNOSIS — R252 Cramp and spasm: Secondary | ICD-10-CM

## 2022-10-08 DIAGNOSIS — R42 Dizziness and giddiness: Secondary | ICD-10-CM

## 2022-10-09 ENCOUNTER — Telehealth: Payer: Self-pay | Admitting: *Deleted

## 2022-10-09 NOTE — Telephone Encounter (Signed)
-----   Message from Melvenia Beam, MD sent at 10/09/2022  3:19 PM EST ----- The chiari has gotten a bit worse now 10-76m I did send in a referral to neurosurgery for you to discuss. 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 I do recommend discussing compression of the chiari with neurosurgery thanks dr aJaynee Eagles

## 2022-10-09 NOTE — Telephone Encounter (Signed)
-----   Message from Melvenia Beam, MD sent at 10/09/2022  3:19 PM EST ----- The chiari has gotten a bit worse now 10-89m I did send in a referral to neurosurgery for you to discuss. 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 I do recommend discussing compression of the chiari with neurosurgery thanks dr aJaynee Eagles

## 2022-10-09 NOTE — Telephone Encounter (Signed)
Spoke to patient have MRI and MRA results Pt had questions about her blood flow in brain I read MRA results to patient . Pt asked about the Syrinx explained to patient there was no fluid pocket  around spinal cord  Gave Patient Dr Ruthine Dose name and #. Pt expressed understanding and thanked me for calling

## 2022-10-09 NOTE — Telephone Encounter (Signed)
-----   Message from Melvenia Beam, MD sent at 10/09/2022  3:19 PM EST ----- The chiari has gotten a bit worse now 10-34m I did send in a referral to neurosurgery for you to discuss. 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 I do recommend discussing compression of the chiari with neurosurgery thanks dr aJaynee Eagles

## 2024-07-11 ENCOUNTER — Encounter (HOSPITAL_COMMUNITY): Payer: Self-pay | Admitting: Obstetrics and Gynecology

## 2024-07-11 NOTE — Progress Notes (Addendum)
 Spoke w/ via phone for pre-op interview--- Danayah Lab needs dos----  CBC, T&S per protocol. BMP EKG and A1C per anesthesia. CBG and UPT day of surgery. Preop lab appt 07/14/24 at 1 PM        Lab results------ COVID test -----patient states asymptomatic no test needed Arrive at -------0530 NPO after MN NO Solid Food.   Pre-Surgery Ensure or G2:  Med rec completed Medications to take morning of surgery ----- NONE Diabetic medication ----- Wears insulin pump. Pt endocrinologist Dr Hosie. Pt contacted MD and awaiting reply about insulin pump instructions for pre/postop.  GLP1 agonist last dose: GLP1 instructions:  Patient instructed no nail polish to be worn day of surgery Patient instructed to bring photo id and insurance card day of surgery Patient aware to have Driver (ride ) / caregiver    for 24 hours after surgery - Joe Burcham-boyfriend Patient Special Instructions -----shower with CHG night before surgery. Pre-Op special Instructions -----Wears Dexcom and insulin pump, will be on each arm day of surgery.  Patient verbalized understanding of instructions that were given at this phone interview. Patient denies chest pain, sob, fever, cough at the interview.

## 2024-07-11 NOTE — Progress Notes (Signed)
 Surgical Instructions  Your procedure is scheduled on :   Report to [facility] Main Entrance A at 5:30 AM, then check in the Admitting office. Any questions or running late day of surgery :  call [phone]  Questions prior to your surgery day:  call [phone], Monday -- Friday 8am - 4pm. If you experience any cold or flu symptoms such as cough, fever, chills, shortness of breath, etc. between now and you scheduled surgery, please notify your surgeon office.   Remember: Do Not eat any food after midnight the night before surgery.     This includes No water,  candy,  gum, and mints.  PLEASE FOLLOW DR LAMBETH'S INSTRUCTIONS ABOUT INSULIN PUMP BASAL SETTINGS.   One week prior to surgery, STOP taking any Aspirin (unless otherwise instructed by your surgeon) Aleve, Naproxen, ibuprofen, Motrin, Advil, Goody's, BC's, all herbal medications/ supplements, fish oil, and non-prescription vitamins.  Do NOT Smoke (tobacco/ vaping) and Do Not drink alcohol for 24 hours prior to your procedure.  For those patients that use a CPAP.  Please bring your CPAP/ mask/ tubing with them day of surgery . Anesthesia may ask recovery room nurse to use and if you stay the night you be asked to use it.  You will be asked to removed any contacts, glasses, piercing's, hearing aid's, dentures/ partials prior to surgery.  Please bring cases/ container/ solution/ etc., for them day of surgery.   Patients discharged the day of surgery will NOT be allowed to drive home.  You must have responsible driver and caregiver to stay at home with you the next 24 hours.  SURGICAL WAITING ROOM VISITATION Patients may have no more than 2 support people in the waiting area - if more than 2 , these visitors may rotate.  Pre-op nurse will coordinate an appropriate time for 1 Adult support person, who may not rotate, to accompany patient in pre-op.  Aware some patients may have certain circumstances, speak to pre-op nurse day of  surgery.  Children under the age 70 must have an adult with them who is not the patient and must remain in the main waiting area with an adult.  If the patient needs to stay at the hospital during part of their recovery, the visitor guidelines for inpatient rooms apply.  Please refer to the [facility] website for the visitor guidelines for any additional information.  If you received a COVID test during your pre-op visit it is requested that you wear a mask when out in public, stay away from anyone that may not be feeling well and notify your surgeon if you develop symptoms.  If you have been in contact with anyone that has tested positive in the past 10 days notify your surgeon.     [facility] - Preparing for Surgery  Before surgery, you can play an important role. Because skin is not sterile, it needs to be as free of germs as possible. You can reduce the number of germs on your skin by washing with CHG (chlorhexidine gluconate) soap before surgery. CHG is an antiseptic cleaner which kills germs and bonds with the skin to continue killing germs even after washing. Oral hygiene is also important in reducing the risk of infection. Remember to brush your teeth with your regular toothpaste the morning of surgery.  Please DO NOT use if you have an allergy to CHG or antibacterial soaps. If your skin becomes reddened/irritated stop using the CHG and inform your Pre-op nurse day of surgery.  DO NOT shave (including legs and genital area) for at least 48 hours prior to your CHG shower.   Please follow these instructions carefully:  Shower with CHG soap the night before surgery. If you choose to wash your hair, wash your hair first as usual with your normal shampoo. After you shampoo, rinse your hair and body thoroughly to remove the shampoo. Use CHG as you would any other liquid soap. You can apply CHG directly to the skin and wash gently with a clean washcloth or shower sponge. Apply the CHG  soap to your body ONLY FROM THE NECK DOWN. Do not use on open wounds or open sores. Avoid contact with your eyes, ears, mouth, and genitals (private parts). Wash genitals (private parts) with your normal soap. Wash thoroughly, paying special attention to the area where your surgery will be performed. Thoroughly rinse your body with warm water from the neck down. DO NOT shower/wash with your normal soap after using and rinsing off the CHG soap. DO NOT use lotions, oils, etc., after showering with CHG. Pat yourself dry with a clean towel. Wear clean pajamas. Place clean sheets on your bed the night of your CHG shower and do not sleep with pets.  Day of Surgery  DO NOT Apply any lotions,  powder,  oils,  deodorants (may use underarm deodorant),  cologne/  perfumes  or makeup Do Not wear jewelry /  piercing's/  metal/  permanent jewelry must be removed prior to arrival day of surgery. (No plastic piercing) Do Not wear nail polish,  gel polish,  artificial nails, or any other type of covering on natural finger nails (toe nails are okay) Remember to brush your teeth and rinse mouth out. Put on clean / comfortable clothes.  is not responsible for valuables/ personal belongings

## 2024-07-14 ENCOUNTER — Inpatient Hospital Stay (HOSPITAL_COMMUNITY): Admission: RE | Admit: 2024-07-14 | Discharge: 2024-07-14 | Attending: Obstetrics and Gynecology

## 2024-07-14 DIAGNOSIS — Z01818 Encounter for other preprocedural examination: Secondary | ICD-10-CM

## 2024-07-14 DIAGNOSIS — E109 Type 1 diabetes mellitus without complications: Secondary | ICD-10-CM

## 2024-07-14 DIAGNOSIS — D219 Benign neoplasm of connective and other soft tissue, unspecified: Secondary | ICD-10-CM

## 2024-07-14 LAB — COMPREHENSIVE METABOLIC PANEL WITH GFR
ALT: 17 U/L (ref 0–44)
AST: 21 U/L (ref 15–41)
Albumin: 3.7 g/dL (ref 3.5–5.0)
Alkaline Phosphatase: 87 U/L (ref 38–126)
Anion gap: 7 (ref 5–15)
BUN: 12 mg/dL (ref 6–20)
CO2: 28 mmol/L (ref 22–32)
Calcium: 9.8 mg/dL (ref 8.9–10.3)
Chloride: 102 mmol/L (ref 98–111)
Creatinine, Ser: 0.67 mg/dL (ref 0.44–1.00)
GFR, Estimated: >60 mL/min (ref >60)
Glucose, Bld: 251 mg/dL — ABNORMAL HIGH (ref 70–99)
Potassium: 4.5 mmol/L (ref 3.5–5.1)
Sodium: 137 mmol/L (ref 135–145)
Total Bilirubin: 1 mg/dL (ref 0.0–1.2)
Total Protein: 7.2 g/dL (ref 6.5–8.1)

## 2024-07-14 LAB — HEMOGLOBIN A1C
Hgb A1c MFr Bld: 7.6 % — ABNORMAL HIGH (ref 4.8–5.6)
Mean Plasma Glucose: 171.42 mg/dL

## 2024-07-14 LAB — TYPE AND SCREEN
ABO/RH(D): O NEG
Antibody Screen: NEGATIVE

## 2024-07-14 LAB — CBC
HCT: 41.6 % (ref 36.0–46.0)
Hemoglobin: 13.2 g/dL (ref 12.0–15.0)
MCH: 30.1 pg (ref 26.0–34.0)
MCHC: 31.7 g/dL (ref 30.0–36.0)
MCV: 95 fL (ref 80.0–100.0)
Platelets: 278 K/uL (ref 150–400)
RBC: 4.38 MIL/uL (ref 3.87–5.11)
RDW: 12.9 % (ref 11.5–15.5)
WBC: 7.7 K/uL (ref 4.0–10.5)
nRBC: 0 % (ref 0.0–0.2)

## 2024-07-15 LAB — SYPHILIS: RPR W/REFLEX TO RPR TITER AND TREPONEMAL ANTIBODIES, TRADITIONAL SCREENING AND DIAGNOSIS ALGORITHM: RPR Ser Ql: NONREACTIVE

## 2024-07-18 NOTE — Anesthesia Preprocedure Evaluation (Signed)
 Anesthesia Evaluation  Patient identified by MRN, date of birth, ID band Patient awake    Reviewed: Allergy & Precautions, H&P , NPO status , Patient's Chart, lab work & pertinent test results  Airway Mallampati: I  TM Distance: >3 FB Neck ROM: Full    Dental no notable dental hx. (+) Dental Advisory Given, Teeth Intact   Pulmonary neg pulmonary ROS, Current Smoker and Patient abstained from smoking.   Pulmonary exam normal breath sounds clear to auscultation       Cardiovascular Exercise Tolerance: Good negative cardio ROS Normal cardiovascular exam Rhythm:Regular Rate:Normal     Neuro/Psych Chiari I malformation negative neurological ROS  negative psych ROS   GI/Hepatic negative GI ROS, Neg liver ROS,,,  Endo/Other  negative endocrine ROSdiabetes, Insulin  Dependent    Renal/GU negative Renal ROS  negative genitourinary   Musculoskeletal negative musculoskeletal ROS (+)    Abdominal   Peds negative pediatric ROS (+)  Hematology negative hematology ROS (+)   Anesthesia Other Findings   Reproductive/Obstetrics negative OB ROS                              Anesthesia Physical Anesthesia Plan  ASA: 3  Anesthesia Plan: General   Post-op Pain Management: Tylenol  PO (pre-op)*, Celebrex  PO (pre-op)*, Dilaudid  IV and Precedex   Induction: Intravenous  PONV Risk Score and Plan: 3  Airway Management Planned: Oral ETT  Additional Equipment: None  Intra-op Plan:   Post-operative Plan: Extubation in OR  Informed Consent: I have reviewed the patients History and Physical, chart, labs and discussed the procedure including the risks, benefits and alternatives for the proposed anesthesia with the patient or authorized representative who has indicated his/her understanding and acceptance.       Plan Discussed with: Anesthesiologist and CRNA  Anesthesia Plan Comments: (  )          Anesthesia Quick Evaluation

## 2024-07-18 NOTE — H&P (Signed)
 Tara Torres is an 53 y.o. female. She has fibroids and very heavy menses. U/S in office notes uterus 10.3 x 7.9 x 7.1 cm with fibroids measuring about 4.5 - 2 cm.   Pertinent Gynecological History: Menses: flow is excessive with use of many pads or tampons on heaviest days Bleeding:  Contraception: none DES exposure: denies Blood transfusions: none Sexually transmitted diseases: no past history Previous GYN Procedures:   Last mammogram: normal Date: 10/24 Last pap: normal Date: 10/24 OB History: G3, P1   Menstrual History: Menarche age:  No LMP recorded (lmp unknown).    Past Medical History:  Diagnosis Date   Chiari I malformation (HCC)    Diabetes mellitus without complication (HCC)     Past Surgical History:  Procedure Laterality Date   NO PAST SURGERIES      Family History  Problem Relation Age of Onset   Chiari malformation Neg Hx     Social History:  reports that she has never smoked. She has never used smokeless tobacco. She reports current alcohol use of about 8.0 standard drinks of alcohol per week. No history on file for drug use.  Allergies: Allergies[1]  No medications prior to admission.    Review of Systems  Constitutional:  Negative for fever.    Height 5' 3 (1.6 m), weight 72.6 kg. Physical Exam Cardiovascular:     Rate and Rhythm: Normal rate.  Pulmonary:     Effort: Pulmonary effort is normal.     No results found for this or any previous visit (from the past 24 hours).  No results found.  Assessment/Plan: 53 yo with very heavy menses and uterine fibroids LAVH/BS D/W patient. She wants to retain ovaries if they appear normal. Will remove one or both ovaries if they appear abnormal. D/W procedure and risks including infection, organ damage, bleeding/transfusion-HIV/Hep, DVT/PE, pneumonia. D/W possible conversion to abdominal hysterectomy. She states she understands and agrees Preop medical clearance with endocrinologist for Type I DM  done. She will continue CGM and insulin  pump on her arms.  Tara Torres II 07/18/2024, 5:51 PM     [1]  Allergies Allergen Reactions   Penicillins Nausea And Vomiting

## 2024-07-19 ENCOUNTER — Other Ambulatory Visit: Payer: Self-pay

## 2024-07-19 ENCOUNTER — Encounter (HOSPITAL_COMMUNITY): Admission: RE | Disposition: A | Payer: Self-pay | Source: Home / Self Care | Attending: Obstetrics and Gynecology

## 2024-07-19 ENCOUNTER — Ambulatory Visit (HOSPITAL_COMMUNITY)
Admission: RE | Admit: 2024-07-19 | Discharge: 2024-07-19 | Disposition: A | Attending: Obstetrics and Gynecology | Admitting: Obstetrics and Gynecology

## 2024-07-19 ENCOUNTER — Encounter (HOSPITAL_COMMUNITY): Payer: Self-pay | Admitting: Obstetrics and Gynecology

## 2024-07-19 ENCOUNTER — Ambulatory Visit (HOSPITAL_COMMUNITY): Payer: Self-pay | Admitting: Anesthesiology

## 2024-07-19 DIAGNOSIS — Z9641 Presence of insulin pump (external) (internal): Secondary | ICD-10-CM | POA: Insufficient documentation

## 2024-07-19 DIAGNOSIS — D259 Leiomyoma of uterus, unspecified: Secondary | ICD-10-CM | POA: Diagnosis present

## 2024-07-19 DIAGNOSIS — N920 Excessive and frequent menstruation with regular cycle: Secondary | ICD-10-CM | POA: Diagnosis not present

## 2024-07-19 DIAGNOSIS — E109 Type 1 diabetes mellitus without complications: Secondary | ICD-10-CM

## 2024-07-19 DIAGNOSIS — D219 Benign neoplasm of connective and other soft tissue, unspecified: Secondary | ICD-10-CM | POA: Diagnosis present

## 2024-07-19 DIAGNOSIS — D251 Intramural leiomyoma of uterus: Secondary | ICD-10-CM | POA: Diagnosis not present

## 2024-07-19 DIAGNOSIS — Z01818 Encounter for other preprocedural examination: Secondary | ICD-10-CM

## 2024-07-19 DIAGNOSIS — Z794 Long term (current) use of insulin: Secondary | ICD-10-CM | POA: Diagnosis not present

## 2024-07-19 HISTORY — PX: LAPAROSCOPIC VAGINAL HYSTERECTOMY WITH SALPINGO OOPHORECTOMY: SHX6681

## 2024-07-19 HISTORY — DX: Type 2 diabetes mellitus without complications: E11.9

## 2024-07-19 LAB — GLUCOSE, CAPILLARY: Glucose-Capillary: 279 mg/dL — ABNORMAL HIGH (ref 70–99)

## 2024-07-19 LAB — POCT PREGNANCY, URINE: Preg Test, Ur: NEGATIVE

## 2024-07-19 SURGERY — HYSTERECTOMY, VAGINAL, LAPAROSCOPY-ASSISTED, WITH SALPINGO-OOPHORECTOMY
Anesthesia: General | Site: Pelvis | Laterality: Bilateral

## 2024-07-19 MED ORDER — CLINDAMYCIN PHOSPHATE 900 MG/50ML IV SOLN
INTRAVENOUS | Status: AC
  Filled 2024-07-19: qty 50

## 2024-07-19 MED ORDER — SOD CITRATE-CITRIC ACID 500-334 MG/5ML PO SOLN: 30.0000 mL | ORAL | Status: DC

## 2024-07-19 MED ORDER — 0.9 % SODIUM CHLORIDE (POUR BTL) OPTIME
TOPICAL | Status: DC | PRN
  Administered 2024-07-19: 08:00:00 1000 mL

## 2024-07-19 MED ORDER — PHENYLEPHRINE 80 MCG/ML (10ML) SYRINGE FOR IV PUSH (FOR BLOOD PRESSURE SUPPORT)
PREFILLED_SYRINGE | INTRAVENOUS | Status: DC | PRN
  Administered 2024-07-19: 08:00:00 80 ug via INTRAVENOUS

## 2024-07-19 MED ORDER — ROCURONIUM BROMIDE 10 MG/ML (PF) SYRINGE
PREFILLED_SYRINGE | INTRAVENOUS | Status: AC
  Filled 2024-07-19: qty 20

## 2024-07-19 MED ORDER — AMISULPRIDE (ANTIEMETIC) 5 MG/2ML IV SOLN
10.0000 mg | Freq: Once | INTRAVENOUS | Status: AC
  Administered 2024-07-19: 12:00:00 10 mg via INTRAVENOUS

## 2024-07-19 MED ORDER — PROPOFOL 10 MG/ML IV BOLUS
INTRAVENOUS | Status: AC
  Filled 2024-07-19: qty 20

## 2024-07-19 MED ORDER — OXYCODONE HCL 5 MG PO TABS: 5.0000 mg | ORAL_TABLET | Freq: Four times a day (QID) | ORAL | 0 refills | Status: AC | PRN

## 2024-07-19 MED ORDER — BUPIVACAINE HCL (PF) 0.5 % IJ SOLN
INTRAMUSCULAR | Status: DC | PRN
  Administered 2024-07-19: 08:00:00 30 mL

## 2024-07-19 MED ORDER — ORAL CARE MOUTH RINSE: 15.0000 mL | Freq: Once | OROMUCOSAL | Status: AC

## 2024-07-19 MED ORDER — LACTATED RINGERS IV SOLN: INTRAVENOUS | Status: DC

## 2024-07-19 MED ORDER — SUGAMMADEX SODIUM 200 MG/2ML IV SOLN
INTRAVENOUS | Status: DC | PRN
  Administered 2024-07-19: 11:00:00 200 mg via INTRAVENOUS

## 2024-07-19 MED ORDER — MIDAZOLAM HCL 2 MG/2ML IJ SOLN
INTRAMUSCULAR | Status: AC
  Filled 2024-07-19: qty 2

## 2024-07-19 MED ORDER — BUPIVACAINE HCL (PF) 0.5 % IJ SOLN
INTRAMUSCULAR | Status: AC
  Filled 2024-07-19: qty 30

## 2024-07-19 MED ORDER — HYDROMORPHONE HCL 1 MG/ML IJ SOLN
0.2000 mg | INTRAMUSCULAR | Status: DC | PRN
  Administered 2024-07-19: 13:00:00 0.6 mg via INTRAVENOUS
  Filled 2024-07-19: qty 1

## 2024-07-19 MED ORDER — FENTANYL CITRATE (PF) 100 MCG/2ML IJ SOLN
INTRAMUSCULAR | Status: AC
  Filled 2024-07-19: qty 2

## 2024-07-19 MED ORDER — MENTHOL 3 MG MT LOZG: 1.0000 | LOZENGE | OROMUCOSAL | Status: DC | PRN

## 2024-07-19 MED ORDER — OXYCODONE HCL 5 MG PO TABS: 5.0000 mg | ORAL_TABLET | Freq: Once | ORAL | Status: DC | PRN

## 2024-07-19 MED ORDER — PHENYLEPHRINE 80 MCG/ML (10ML) SYRINGE FOR IV PUSH (FOR BLOOD PRESSURE SUPPORT)
PREFILLED_SYRINGE | INTRAVENOUS | Status: AC
  Filled 2024-07-19: qty 10

## 2024-07-19 MED ORDER — CHLORHEXIDINE GLUCONATE 0.12 % MT SOLN
15.0000 mL | Freq: Once | OROMUCOSAL | Status: AC
  Administered 2024-07-19: 07:00:00 15 mL via OROMUCOSAL

## 2024-07-19 MED ORDER — ACETAMINOPHEN 500 MG PO TABS
ORAL_TABLET | ORAL | Status: AC
  Filled 2024-07-19: qty 2

## 2024-07-19 MED ORDER — ACETAMINOPHEN 500 MG PO TABS
1000.0000 mg | ORAL_TABLET | Freq: Four times a day (QID) | ORAL | Status: DC
  Administered 2024-07-19: 16:00:00 1000 mg via ORAL
  Filled 2024-07-19: qty 2

## 2024-07-19 MED ORDER — ONDANSETRON HCL 4 MG/2ML IJ SOLN
INTRAMUSCULAR | Status: DC | PRN
  Administered 2024-07-19: 10:00:00 4 mg via INTRAVENOUS

## 2024-07-19 MED ORDER — LIDOCAINE 2% (20 MG/ML) 5 ML SYRINGE
INTRAMUSCULAR | Status: AC
  Filled 2024-07-19: qty 5

## 2024-07-19 MED ORDER — LIDOCAINE 2% (20 MG/ML) 5 ML SYRINGE
INTRAMUSCULAR | Status: DC | PRN
  Administered 2024-07-19 (×5): 80 mg via INTRAVENOUS

## 2024-07-19 MED ORDER — PROPOFOL 10 MG/ML IV BOLUS
INTRAVENOUS | Status: DC | PRN
  Administered 2024-07-19: 08:00:00 150 mg via INTRAVENOUS

## 2024-07-19 MED ORDER — OXYCODONE HCL 5 MG/5ML PO SOLN: 5.0000 mg | Freq: Once | ORAL | Status: DC | PRN

## 2024-07-19 MED ORDER — ONDANSETRON HCL 4 MG/2ML IJ SOLN
INTRAMUSCULAR | Status: AC
  Filled 2024-07-19: qty 2

## 2024-07-19 MED ORDER — ONDANSETRON HCL 4 MG PO TABS: 4.0000 mg | ORAL_TABLET | Freq: Four times a day (QID) | ORAL | Status: DC | PRN

## 2024-07-19 MED ORDER — FENTANYL CITRATE (PF) 100 MCG/2ML IJ SOLN
25.0000 ug | INTRAMUSCULAR | Status: DC | PRN
  Administered 2024-07-19: 12:00:00 25 ug via INTRAVENOUS
  Administered 2024-07-19 (×2): 50 ug via INTRAVENOUS
  Administered 2024-07-19: 12:00:00 25 ug via INTRAVENOUS

## 2024-07-19 MED ORDER — DEXAMETHASONE SOD PHOSPHATE PF 10 MG/ML IJ SOLN
INTRAMUSCULAR | Status: DC | PRN
  Administered 2024-07-19: 08:00:00 8 mg via INTRAVENOUS

## 2024-07-19 MED ORDER — ACETAMINOPHEN 500 MG PO TABS: 1000.0000 mg | ORAL_TABLET | Freq: Four times a day (QID) | ORAL | 0 refills | Status: AC | PRN

## 2024-07-19 MED ORDER — FLUORESCEIN SODIUM 10 % IV SOLN
INTRAVENOUS | Status: AC
  Filled 2024-07-19: qty 5

## 2024-07-19 MED ORDER — INSULIN PUMP
Freq: Three times a day (TID) | SUBCUTANEOUS | Status: DC
  Filled 2024-07-19: qty 1

## 2024-07-19 MED ORDER — POLYETHYLENE GLYCOL 3350 17 G PO PACK: 17.0000 g | PACK | Freq: Every day | ORAL | Status: DC | PRN

## 2024-07-19 MED ORDER — ALUM & MAG HYDROXIDE-SIMETH 200-200-20 MG/5ML PO SUSP: 30.0000 mL | ORAL | Status: DC | PRN

## 2024-07-19 MED ORDER — ACETAMINOPHEN 500 MG PO TABS
1000.0000 mg | ORAL_TABLET | Freq: Once | ORAL | Status: AC
  Administered 2024-07-19: 07:00:00 1000 mg via ORAL

## 2024-07-19 MED ORDER — IBUPROFEN 600 MG PO TABS
600.0000 mg | ORAL_TABLET | Freq: Four times a day (QID) | ORAL | Status: DC
  Administered 2024-07-19: 16:00:00 600 mg via ORAL
  Filled 2024-07-19: qty 1

## 2024-07-19 MED ORDER — TRANEXAMIC ACID-NACL 1000-0.7 MG/100ML-% IV SOLN
INTRAVENOUS | Status: AC
  Filled 2024-07-19: qty 100

## 2024-07-19 MED ORDER — ROCURONIUM BROMIDE 10 MG/ML (PF) SYRINGE
PREFILLED_SYRINGE | INTRAVENOUS | Status: DC | PRN
  Administered 2024-07-19 (×2): 20 mg via INTRAVENOUS
  Administered 2024-07-19: 08:00:00 60 mg via INTRAVENOUS
  Administered 2024-07-19: 11:00:00 10 mg via INTRAVENOUS

## 2024-07-19 MED ORDER — SODIUM CHLORIDE 0.9 % IR SOLN
Status: DC | PRN
  Administered 2024-07-19: 09:00:00 1000 mL

## 2024-07-19 MED ORDER — TRANEXAMIC ACID-NACL 1000-0.7 MG/100ML-% IV SOLN
1000.0000 mg | Freq: Once | INTRAVENOUS | Status: AC
  Administered 2024-07-19: 08:00:00 1000 mg via INTRAVENOUS

## 2024-07-19 MED ORDER — SENNA 8.6 MG PO TABS
1.0000 | ORAL_TABLET | Freq: Two times a day (BID) | ORAL | Status: DC
  Administered 2024-07-19: 16:00:00 8.6 mg via ORAL
  Filled 2024-07-19: qty 1

## 2024-07-19 MED ORDER — CELECOXIB 200 MG PO CAPS
ORAL_CAPSULE | ORAL | Status: AC
  Filled 2024-07-19: qty 1

## 2024-07-19 MED ORDER — MEPERIDINE HCL 25 MG/ML IJ SOLN: 6.2500 mg | INTRAMUSCULAR | Status: DC | PRN

## 2024-07-19 MED ORDER — GENTAMICIN SULFATE 40 MG/ML IJ SOLN
5.0000 mg/kg | INTRAVENOUS | Status: AC
  Administered 2024-07-19: 08:00:00 363.2 mg via INTRAVENOUS
  Filled 2024-07-19: qty 9

## 2024-07-19 MED ORDER — CHLORHEXIDINE GLUCONATE 0.12 % MT SOLN
OROMUCOSAL | Status: AC
  Filled 2024-07-19: qty 15

## 2024-07-19 MED ORDER — CLINDAMYCIN PHOSPHATE 900 MG/50ML IV SOLN
900.0000 mg | INTRAVENOUS | Status: AC
  Administered 2024-07-19: 08:00:00 900 mg via INTRAVENOUS

## 2024-07-19 MED ORDER — IBUPROFEN 600 MG PO TABS: 600.0000 mg | ORAL_TABLET | Freq: Four times a day (QID) | ORAL | 0 refills | Status: AC | PRN

## 2024-07-19 MED ORDER — HEMOSTATIC AGENTS (NO CHARGE) OPTIME
TOPICAL | Status: DC | PRN
  Administered 2024-07-19: 11:00:00 1 via TOPICAL

## 2024-07-19 MED ORDER — METHYLENE BLUE 20 MG/2ML IV SOSY
PREFILLED_SYRINGE | INTRAVENOUS | Status: AC
  Filled 2024-07-19: qty 2

## 2024-07-19 MED ORDER — ONDANSETRON HCL 4 MG/2ML IJ SOLN
4.0000 mg | Freq: Four times a day (QID) | INTRAMUSCULAR | Status: DC | PRN
  Administered 2024-07-19: 13:00:00 4 mg via INTRAVENOUS
  Filled 2024-07-19: qty 2

## 2024-07-19 MED ORDER — OXYCODONE HCL 5 MG PO TABS: 5.0000 mg | ORAL_TABLET | ORAL | Status: DC | PRN

## 2024-07-19 MED ORDER — CELECOXIB 200 MG PO CAPS
200.0000 mg | ORAL_CAPSULE | Freq: Once | ORAL | Status: AC
  Administered 2024-07-19: 07:00:00 200 mg via ORAL

## 2024-07-19 MED ORDER — MIDAZOLAM HCL (PF) 2 MG/2ML IJ SOLN
INTRAMUSCULAR | Status: DC | PRN
  Administered 2024-07-19: 08:00:00 2 mg via INTRAVENOUS

## 2024-07-19 MED ORDER — POVIDONE-IODINE 10 % EX SWAB
2.0000 | Freq: Once | CUTANEOUS | Status: AC
  Administered 2024-07-19: 07:00:00 2 via TOPICAL

## 2024-07-19 MED ORDER — FENTANYL CITRATE (PF) 250 MCG/5ML IJ SOLN
INTRAMUSCULAR | Status: DC | PRN
  Administered 2024-07-19: 10:00:00 25 ug via INTRAVENOUS
  Administered 2024-07-19: 08:00:00 100 ug via INTRAVENOUS
  Administered 2024-07-19: 08:00:00 50 ug via INTRAVENOUS
  Administered 2024-07-19: 09:00:00 25 ug via INTRAVENOUS

## 2024-07-19 MED ORDER — AMISULPRIDE (ANTIEMETIC) 5 MG/2ML IV SOLN
INTRAVENOUS | Status: AC
  Filled 2024-07-19: qty 4

## 2024-07-19 MED ORDER — ONDANSETRON HCL 4 MG/2ML IJ SOLN: 4.0000 mg | Freq: Once | INTRAMUSCULAR | Status: DC | PRN

## 2024-07-19 SURGICAL SUPPLY — 47 items
CATH ROBINSON RED A/P 16FR (CATHETERS) ×1 IMPLANT
CHLORAPREP W/TINT 26 (MISCELLANEOUS) ×1 IMPLANT
COVER BACK TABLE 60X90IN (DRAPES) ×1 IMPLANT
COVER MAYO STAND STRL (DRAPES) ×1 IMPLANT
COVER SURGICAL LIGHT HANDLE (MISCELLANEOUS) IMPLANT
DERMABOND ADVANCED .7 DNX12 (GAUZE/BANDAGES/DRESSINGS) ×1 IMPLANT
DRAPE SURG IRRIG POUCH 19X23 (DRAPES) ×1 IMPLANT
DRSG OPSITE POSTOP 3X4 (GAUZE/BANDAGES/DRESSINGS) ×1 IMPLANT
ELECTRODE REM PT RTRN 9FT ADLT (ELECTROSURGICAL) ×1 IMPLANT
GLOVE BIO SURGEON STRL SZ 6.5 (GLOVE) IMPLANT
GLOVE BIO SURGEON STRL SZ7 (GLOVE) IMPLANT
GLOVE BIO SURGEON STRL SZ8 (GLOVE) ×3 IMPLANT
GLOVE BIOGEL PI IND STRL 7.0 (GLOVE) IMPLANT
GLOVE BIOGEL PI IND STRL 7.5 (GLOVE) IMPLANT
GOWN STRL REUS W/ TWL LRG LVL3 (GOWN DISPOSABLE) IMPLANT
GOWN STRL REUS W/ TWL XL LVL3 (GOWN DISPOSABLE) ×3 IMPLANT
GOWN STRL SURGICAL XL XLNG (GOWN DISPOSABLE) IMPLANT
HIBICLENS CHG 4% 4OZ (MISCELLANEOUS) IMPLANT
HOLDER FOLEY CATH W/STRAP (MISCELLANEOUS) IMPLANT
IRRIGATION SUCT STRKRFLW 2 WTP (MISCELLANEOUS) IMPLANT
IV 0.9% NACL 1000 ML (IV SOLUTION) IMPLANT
KIT PINK PAD W/HEAD ARM REST (MISCELLANEOUS) ×1 IMPLANT
KIT TURNOVER KIT B (KITS) ×1 IMPLANT
LIGASURE IMPACT 36 18CM CVD LR (INSTRUMENTS) ×1 IMPLANT
NDL INSUFFLATION 14GA 120MM (NEEDLE) ×1 IMPLANT
NEEDLE INSUFFLATION 14GA 120MM (NEEDLE) ×1 IMPLANT
PACK LAVH (CUSTOM PROCEDURE TRAY) ×1 IMPLANT
POWDER SURGICEL 3.0 GRAM (HEMOSTASIS) IMPLANT
SEALER TISSUE X1 CVD 45CM (MISCELLANEOUS) ×1 IMPLANT
SET TUBE SMOKE EVAC HIGH FLOW (TUBING) ×1 IMPLANT
SLEEVE ADV FIXATION 5X100MM (TROCAR) IMPLANT
SOLN 0.9% NACL POUR BTL 1000ML (IV SOLUTION) ×1 IMPLANT
SPECIMEN JAR MEDIUM (MISCELLANEOUS) ×1 IMPLANT
SPIKE FLUID TRANSFER (MISCELLANEOUS) ×1 IMPLANT
SUT MNCRL 0 MO-4 VIOLET 18 CR (SUTURE) ×2 IMPLANT
SUT VIC AB 4-0 PS2 18 (SUTURE) ×1 IMPLANT
SUT VICRYL 0 UR6 27IN ABS (SUTURE) ×1 IMPLANT
SYR 30ML LL (SYRINGE) ×1 IMPLANT
SYSTEM RETRIEVL 5MM INZII UNIV (BASKET) IMPLANT
TIP ENDOSCOPIC SURGICEL (TIP) IMPLANT
TOWEL GREEN STERILE FF (TOWEL DISPOSABLE) ×2 IMPLANT
TRAY FOLEY W/BAG SLVR 14FR (SET/KITS/TRAYS/PACK) ×1 IMPLANT
TROCAR 11X100 Z THREAD (TROCAR) ×1 IMPLANT
TROCAR BALLN 12MMX100 BLUNT (TROCAR) IMPLANT
TROCAR Z-THREAD FIOS 5X100MM (TROCAR) ×1 IMPLANT
UNDERPAD 30X36 HEAVY ABSORB (UNDERPADS AND DIAPERS) ×1 IMPLANT
WARMER LAPAROSCOPE (MISCELLANEOUS) ×1 IMPLANT

## 2024-07-19 NOTE — Transfer of Care (Signed)
 Immediate Anesthesia Transfer of Care Note  Patient: Tara Torres  Procedure(s) Performed: HYSTERECTOMY, VAGINAL, LAPAROSCOPY-ASSISTED, WITH SALPINGECTOMY (Bilateral: Pelvis)  Patient Location: PACU  Anesthesia Type:General  Level of Consciousness: awake, alert , and sedated  Airway & Oxygen Therapy: Patient Spontanous Breathing and Patient connected to nasal cannula oxygen  Post-op Assessment: Report given to RN and Post -op Vital signs reviewed and stable  Post vital signs: Reviewed and stable  Last Vitals:  Vitals Value Taken Time  BP 117/57 07/19/24 11:26  Temp 36.7 C 07/19/24 11:26  Pulse 75 07/19/24 11:27  Resp 15 07/19/24 11:27  SpO2 100 % 07/19/24 11:27  Vitals shown include unfiled device data.  Last Pain:  Vitals:   07/19/24 0626  TempSrc: Oral  PainSc: 0-No pain      Patients Stated Pain Goal: 6 (07/19/24 9373)  Complications: No notable events documented.

## 2024-07-19 NOTE — Progress Notes (Signed)
 Great pain control, tolerating regular diet, voiding, positive flatus, ambulating without difficulty. Wants to go home.  Today's Vitals   07/19/24 1253 07/19/24 1313 07/19/24 1343 07/19/24 1428  BP: (!) 111/50   (!) 126/57  Pulse: 63   71  Resp: 16   16  Temp: (!) 97.5 F (36.4 C)     TempSrc: Oral     SpO2: 97%   98%  Weight:      Height:      PainSc:  7  3     Body mass index is 28.34 kg/m.   Abdomen soft, BS normal, incisions clean  A/P: D/C home         Instructions given         Oxycodone , Tylenol , ibuprofen  reviewed         FU office next week

## 2024-07-19 NOTE — Op Note (Unsigned)
 NAME: Tara Torres, [place] MEDICAL RECORD NO: [id number] ACCOUNT NO: [id number] DATE OF BIRTH: 01-24-1971 FACILITY: [facility] LOCATION: [facility] PHYSICIAN: Lynwood BRAVO. Santiaga Butzin II, MD  Operative Report   DATE OF PROCEDURE: 07/19/2024  PREOPERATIVE DIAGNOSIS:  Uterine leiomyomata.  POSTOPERATIVE DIAGNOSIS:  Uterine leiomyomata.  PROCEDURE:  Laparoscopically assisted vaginal hysterectomy with bilateral salpingectomy.  SURGEON:  Kerrick Miler E. Kayzen Kendzierski II, MD  ASSISTANT:  Truman Corona, MD  ANESTHESIA:  General with endotracheal intubation, Jackee Gene FREEZE: 00:36]  SPECIMENS:  Uterus, bilateral fallopian tubes to pathology.  ESTIMATED BLOOD LOSS:  150 mL.  INDICATIONS AND CONSENT:  This patient is a 53 year old patient with known uterine leiomyomata and recurrent very heavy menses refractory to outpatient management.  After discussion of options, laparoscopically assisted vaginal hysterectomy with  bilateral salpingectomy has been discussed.  After a thorough discussion of risks and benefits, she wants to retain normal appearing ovaries and remove one or both ovaries if they appear abnormal.  Risks and complications are discussed preoperatively  including but not limited to infection, organ damage, bleeding requiring transfusion of blood products with HIV and hepatitis acquisition, DVT and PE, pneumonia, and transition to laparotomy.  She states she understands and agrees and consent was signed  on the chart.  FINDINGS:  Upper abdomen is grossly normal.  The uterus is about 14 weeks in size with intramural and subserosal uterine leiomyomata.  Tubes and ovaries are normal bilaterally.  There are adhesions of the bladder peritoneum on the lower uterine segment  status post cesarean section.  Posterior cul-de-sac is normal.  DESCRIPTION OF PROCEDURE:  The patient is taken to the operating room where she is identified, placed in the dorsal supine position, and general anesthesia is induced via endotracheal  intubation.  She is then placed in the dorsal lithotomy position.  She  is prepped vaginally with Betadine , bladder straight catheterized, and prepped abdominally with ChloraPrep.  Timeout is done.  After a 3-minute drying time, she is draped in a sterile fashion.  She is status post abdominoplasty.  The area below the  umbilicus and suprapubically in the midline is injected with approximately 10 mL of 0.5% plain Marcaine .  An infraumbilical incision is made.  Then the peritoneal cavity is entered safely in layers.  The angles of the fascia are marked at the 3 and 9  o'clock positions.  The disposable open laparoscopic trocar sleeve is placed and anchored in place with the Vicryl suture.  Pneumoperitoneum is induced.  A small suprapubic incision is made just below the abdominoplasty scar and a 5 mm trocar sleeve is  placed under direct visualization without difficulty.  The above findings are noted.  Then using the EnSeal bipolar cautery cutting instrument, the proximal ligaments are taken down on the right side.  This is then transitioned to the left side.  This is  carried out down to the level of the vesicouterine peritoneum.  The vesicouterine peritoneum that had been advanced up on the lower uterine segment is then taken down with the EnSeal as well.  During this process to aid in exposure, a second incision is  made in the left lower quadrant of the abdomen and a second 5 mm trocar sleeve is introduced under direct visualization.  Suction and lavage are also carried out.  The 5 mm ports those have inflation retention rings and have minimal exposure  intraperitoneally.  Therefore, they are left in place, instruments are removed, and attention is turned to the vagina.  The posterior cul-de-sac is entered  sharply.  The cervix is circumscribed with unipolar cautery and the mucosa is advanced sharply and  bluntly.  The LigaSure bipolar cautery cutting instrument is then used to take down the uterosacral  ligaments bilaterally.  Progressive bites are taken on either side along the bladder pillars, cardinal ligaments, and the uterine vessels are taken as  well.  The anterior cul-de-sac is successfully entered.  Attempts at delivering the uterus posteriorly reveal that it is too large.  Therefore, the cervix and part of the lower segment is cored out in an apple core fashion under good visualization.  This  allows decompression of the uterus enough to deliver it posteriorly and the specimen is therefore fully delivered.  All suture will be 0 Monocryl unless otherwise designated.  The uterosacral ligaments are plicated to cuff bilaterally and then plicated  in the midline with a third suture.  Cuff is closed with figure-of-eights.  Foley catheter is placed in the bladder and clear urine is noted.  Attention is returned to the abdomen.  Lavage and suction is carried out.  While doing this, a 2.5 cm fibroid  is known to be floating in the posterior cul-de-sac.  This presumably came from manipulation and morcellation of the uterus.  Therefore, it is removed from the peritoneal cavity with a 5 mm Endocatch bag that is used to retrieve it and then that bag is  then pulled out through the umbilical trocar sleeve.  Minor bipolar cautery is used to assure hemostasis throughout the operative field.  Inspection under reduced pneumoperitoneum reveals good hemostasis.  The uterosacral ligaments are plicated to the  cuff bilaterally.  At this point, the left fallopian tube is easily identified in the field.  It is taken down and removed with the LigaSure device.  The uterosacral ligaments are then plicated in the midline with a third suture and the cuff is closed  with figure-of-eights.  Foley catheter is placed and clear urine is noted.  Attention is returned to the abdomen, pneumoperitoneum is recreated.  Lavage and suction are carried out.  A 2.5 cm fibroid is then noted in the posterior cul-de-sac that had  presumably  been detached during manipulation and morcellation of the uterus.  It is eventually removed with a 5 mm Endocatch bag that is retrieved through the umbilical trocar sleeve.  The right fallopian tube is also taken down with the EnSeal device  and easily retrieved through the umbilical trocar sleeve.  Surgicel is placed over the surgical bed after hemostasis had been assured with bipolar cautery.  After a minute, the excess Surgicel is irrigated and the lavage is suctioned away.  The remaining  approximately 20 mL of Marcaine  are instilled into the peritoneal cavity.  Suprapubic trocar sleeves are removed, good hemostasis is noted, and the umbilical trocar sleeve is removed, decompressing the abdomen.  The 3 and 9 o'clock angle sutures of 0  Vicryl are then used under good visualization and good exposure to close the fascia.  Inspection reveals good closure.  All incisions are closed with 2-0 Vicryl mattress sutures.  Dermabond is placed on all the incisions as well.  All counts are correct.   The patient is awakened, taken to the recovery room in stable condition.   VAI D: 07/19/2024 6:25:01 pm T: 07/19/2024 8:01:00 pm  JOB: [id number]/ [id number]

## 2024-07-19 NOTE — Inpatient Diabetes Management (Addendum)
 Inpatient Diabetes Program Recommendations  AACE/ADA: New Consensus Statement on Inpatient Glycemic Control (2015)  Target Ranges:  Prepandial:   less than 140 mg/dL      Peak postprandial:   less than 180 mg/dL (1-2 hours)      Critically ill patients:  140 - 180 mg/dL   Lab Results  Component Value Date   GLUCAP 279 (H) 07/19/2024   HGBA1C 7.6 (H) 07/14/2024    Review of Glycemic Control  Diabetes history: DM type 1 Outpatient Diabetes medications:  CURRENT INSULIN  PUMP SETTINGS (OmniPod 5) BASAL RATES 00:00 0.70 03:00 0.75 15:00 0.70 18:00 0.75  CARB RATIO 00:00 10  CORRECTION FACTOR 0:00 40  TARGET BLOOD GLUCOSE 00:00 110-110  INSULIN  DURATION OF ACTION 3 hours  TOTAL DAILY INSULIN  = 24 units (79% basal, 21% bolus)  Current orders for Inpatient glycemic control:  None  Endocrinologist: Dr. Hosie last appointment 9/15. Maintains A1c between 7-8%  Inpatient Diabetes Program Recommendations:    -  Place orders for insulin  pump order set for pt to operate the pump while in the hospital.  -   Please inform pt she received Steroids Decadron  8 mg during surgery so she can adjust her insulin  pump accordingly and know the reason of her glucose trends running potentially higher.   Thanks,  Clotilda Bull RN, MSN, BC-ADM Inpatient Diabetes Coordinator Team Pager [phone] (8a-5p)

## 2024-07-19 NOTE — Anesthesia Procedure Notes (Signed)
 Procedure Name: Intubation Date/Time: 07/19/2024 7:46 AM  Performed by: Ricky Doan C, CRNAPre-anesthesia Checklist: Patient identified, Emergency Drugs available, Suction available and Patient being monitored Patient Re-evaluated:Patient Re-evaluated prior to induction Oxygen Delivery Method: Circle system utilized Preoxygenation: Pre-oxygenation with 100% oxygen Induction Type: IV induction Ventilation: Mask ventilation without difficulty Laryngoscope Size: Miller and 2 Grade View: Grade I Tube type: Oral Tube size: 7.0 mm Number of attempts: 1 Airway Equipment and Method: Stylet and Oral airway Placement Confirmation: ETT inserted through vocal cords under direct vision, positive ETCO2 and breath sounds checked- equal and bilateral Secured at: 19 cm Tube secured with: Tape Dental Injury: Teeth and Oropharynx as per pre-operative assessment

## 2024-07-19 NOTE — Brief Op Note (Signed)
 07/19/2024  6:04 PM  PATIENT:  Tara Torres  53 y.o. female  PRE-OPERATIVE DIAGNOSIS:  Fibroids  POST-OPERATIVE DIAGNOSIS:  Fibroids  PROCEDURE:  Procedures: HYSTERECTOMY, VAGINAL, LAPAROSCOPY-ASSISTED, WITH SALPINGECTOMY (Bilateral)  SURGEON:  Surgeons and Role:    * Curlene Agent, MD - Primary    * Latisha Medford, MD - Assisting  PHYSICIAN ASSISTANT:   ASSISTANTS:    ANESTHESIA:   general  EBL:  150 mL   BLOOD ADMINISTERED:none  DRAINS: Urinary Catheter (Foley)   LOCAL MEDICATIONS USED:  MARCAINE     and Amount: 30 ml  SPECIMEN:  Source of Specimen:  uterus, bilateral tubes  DISPOSITION OF SPECIMEN:  PATHOLOGY  COUNTS:  YES  TOURNIQUET:  * No tourniquets in log *  DICTATION: .Other Dictation: Dictation Number [id number]  PLAN OF CARE: Admit for overnight observation  PATIENT DISPOSITION:  PACU - hemodynamically stable.   Delay start of Pharmacological VTE agent (>24hrs) due to surgical blood loss or risk of bleeding: not applicable

## 2024-07-19 NOTE — Progress Notes (Signed)
 Discharge instructions and prescriptions given to pt. Discussed post lap hysterectomy care, signs and symptoms to report to the MD, upcoming appointments, and meds. Pt verbalizes understanding and has no questions or concerns at this time. Pt discharged home from hospital in stable condition.

## 2024-07-19 NOTE — Progress Notes (Signed)
 No changes to H&P per patient history  Reviewed procedure-LAVH/BS/removal of one or both ovaries if abnormal Allergies: PCN>nausea All questions answered She states she understands and agrees

## 2024-07-19 NOTE — Progress Notes (Signed)
 Dr. Mallory at bedside discussing blood sugar management plan with pt. MDA aware of CBG 279 pre-op and pt has omnipod running at basal rate. Will hold additional insulin  at this time.

## 2024-07-20 ENCOUNTER — Encounter (HOSPITAL_COMMUNITY): Payer: Self-pay | Admitting: Obstetrics and Gynecology

## 2024-07-20 NOTE — Anesthesia Postprocedure Evaluation (Signed)
 Anesthesia Post Note  Patient: Tara Torres  Procedure(s) Performed: HYSTERECTOMY, VAGINAL, LAPAROSCOPY-ASSISTED, WITH SALPINGECTOMY (Bilateral: Pelvis)     Patient location during evaluation: PACU Anesthesia Type: General Level of consciousness: awake and alert Pain management: pain level controlled Vital Signs Assessment: post-procedure vital signs reviewed and stable Respiratory status: spontaneous breathing, nonlabored ventilation, respiratory function stable and patient connected to nasal cannula oxygen Cardiovascular status: blood pressure returned to baseline and stable Postop Assessment: no apparent nausea or vomiting Anesthetic complications: no   No notable events documented.  Last Vitals:  Vitals:   07/19/24 1428 07/19/24 1946  BP: (!) 126/57 (!) 114/50  Pulse: 71 71  Resp: 16 17  Temp:  36.8 C  SpO2: 98% 100%    Last Pain:  Vitals:   07/19/24 1946  TempSrc: Oral  PainSc:    Pain Goal: Patients Stated Pain Goal: 6 (07/19/24 9373)                 Decarla Siemen

## 2024-07-21 LAB — SURGICAL PATHOLOGY
